# Patient Record
Sex: Female | Born: 1970 | Race: Black or African American | Hispanic: No | Marital: Single | Smoking: Never smoker
Health system: Southern US, Community
[De-identification: ages and names within clinical notes are randomized; demographics above are authoritative.]

## PROBLEM LIST (undated history)

## (undated) DIAGNOSIS — K5792 Diverticulitis of intestine, part unspecified, without perforation or abscess without bleeding: Secondary | ICD-10-CM

## (undated) DIAGNOSIS — N35919 Unspecified urethral stricture, male, unspecified site: Secondary | ICD-10-CM

## (undated) DIAGNOSIS — G43909 Migraine, unspecified, not intractable, without status migrainosus: Secondary | ICD-10-CM

## (undated) DIAGNOSIS — T7840XA Allergy, unspecified, initial encounter: Secondary | ICD-10-CM

## (undated) DIAGNOSIS — Z8489 Family history of other specified conditions: Secondary | ICD-10-CM

## (undated) HISTORY — DX: Migraine, unspecified, not intractable, without status migrainosus: G43.909

## (undated) HISTORY — DX: Unspecified urethral stricture, male, unspecified site: N35.919

## (undated) HISTORY — DX: Diverticulitis of intestine, part unspecified, without perforation or abscess without bleeding: K57.92

## (undated) HISTORY — DX: Allergy, unspecified, initial encounter: T78.40XA

---

## 2001-06-10 ENCOUNTER — Other Ambulatory Visit: Admission: RE | Admit: 2001-06-10 | Discharge: 2001-06-10 | Payer: Self-pay | Admitting: Gynecology

## 2002-06-16 ENCOUNTER — Other Ambulatory Visit: Admission: RE | Admit: 2002-06-16 | Discharge: 2002-06-16 | Payer: Self-pay | Admitting: Gynecology

## 2003-06-18 ENCOUNTER — Other Ambulatory Visit: Admission: RE | Admit: 2003-06-18 | Discharge: 2003-06-18 | Payer: Self-pay | Admitting: Gynecology

## 2004-06-20 ENCOUNTER — Other Ambulatory Visit: Admission: RE | Admit: 2004-06-20 | Discharge: 2004-06-20 | Payer: Self-pay | Admitting: Gynecology

## 2004-07-05 ENCOUNTER — Encounter: Admission: RE | Admit: 2004-07-05 | Discharge: 2004-07-05 | Payer: Self-pay | Admitting: Gynecology

## 2005-05-09 ENCOUNTER — Other Ambulatory Visit: Admission: RE | Admit: 2005-05-09 | Discharge: 2005-05-09 | Payer: Self-pay | Admitting: Gynecology

## 2006-05-15 ENCOUNTER — Other Ambulatory Visit: Admission: RE | Admit: 2006-05-15 | Discharge: 2006-05-15 | Payer: Self-pay | Admitting: Gynecology

## 2006-05-29 ENCOUNTER — Encounter: Admission: RE | Admit: 2006-05-29 | Discharge: 2006-05-29 | Payer: Self-pay | Admitting: Gynecology

## 2007-05-23 ENCOUNTER — Other Ambulatory Visit: Admission: RE | Admit: 2007-05-23 | Discharge: 2007-05-23 | Payer: Self-pay | Admitting: Gynecology

## 2008-01-16 ENCOUNTER — Ambulatory Visit: Payer: Self-pay | Admitting: Nurse Practitioner

## 2009-05-31 ENCOUNTER — Encounter: Admission: RE | Admit: 2009-05-31 | Discharge: 2009-05-31 | Payer: Self-pay | Admitting: Gynecology

## 2010-06-01 ENCOUNTER — Encounter: Admission: RE | Admit: 2010-06-01 | Discharge: 2010-06-01 | Payer: Self-pay | Admitting: Gynecology

## 2011-02-14 NOTE — Assessment & Plan Note (Signed)
NAMESHERVON, KERWIN NO.:  [id number]   MEDICAL RECORD NO.:  [id number]          PATIENT TYPE:  POB   LOCATION:  [facility] at [facility]         FACILITY:  [facility]   PHYSICIAN:  Elsie Lincoln, MD      DATE OF BIRTH:  [birth year]   DATE OF SERVICE:  01/16/2008                                  CLINIC NOTE   The patient comes to the office today for a consultation for her  migraine headaches.  The patient was referred from Dr. Johnn Hai office,  and specifically Jeani Sow, nurse practitioner, referred her.  She has  been working with this patient from a hormonal standpoint, and they have  not had any success to this point.  She states that her headaches began  in her early 42s.  She does have a mother and a sister who have severe  headaches.  Her sister, in fact, has a headache almost every day.  She  denies any aura.  She does have sensitivity to light, sounds.  She  occasionally has nausea and rarely has vomiting.  In a 30-day  standpoint, she may have three severe, one moderate, zero mild.  She  feels that her headaches go from 0 to 10 within a 20-minute period.  She  denies any increase in stress.  She is sleeping well.  She does not have  any social stressors at this point.  She has been taking Advil and  Tylenol occasionally.  The Tylenol has been no help and the Advil has  been some help.   MENSTRUAL HISTORY:  She has a regular menstrual cycle with light flow.  She is using condoms for birth control.   OBSTETRICAL HISTORY:  She is G0, P0.   GYNECOLOGIC HISTORY:  Last Pap smear was August of 2008.  No abnormal  Pap smears.   SURGICAL HISTORY:  No surgeries.   FAMILY HISTORY:  History of diabetes and high blood pressure in her  mother only.   MEDICATIONS:  NuvaRing.   PHYSICAL EXAMINATION:  VITAL SIGNS:  Blood pressure is 115/88, pulse is  98.  Weight is 129.  Height 5 feet 5 inches.   ALLERGIES:  No known allergies.   PHYSICAL EXAMINATION:  GENERAL:  A  well-developed, well-nourished 40-  year-old African-American female in no acute distress.  NEUROLOGIC:  The patient is intact.  She is well coordinated,  appropriate language and motor skills.  CARDIAC:  Regular rate and rhythm.  LUNGS:  Clear bilaterally without rales, rhonchi or wheezes.   ASSESSMENT:  Migraine without aura.   PLAN:  We had a lengthy discussion concerning migraine prodrome.  She is  not paying attention to her prodrome which is mental slowness.  She will  pay attention to that in the future.  She was given a prescription today  for Maxalt MLT to take with two Advil at the beginning of a headache.  She is given a prescription for #9 tablets with three refills.  She is  given a sample in the office today and tolerated that with no  difficulty.  We did discuss triggers for her migraines, and she will  follow up in this office p.r.n.  She can get her Maxalt refilled with  Jeani Sow, nurse practitioner, or she can return to see me at her  convenience.      Remonia Richter, NP    ______________________________  Elsie Lincoln, MD    LR/MEDQ  D:  01/16/2008  T:  01/16/2008  Job:  [id number]

## 2011-04-17 ENCOUNTER — Other Ambulatory Visit: Payer: Self-pay | Admitting: Gynecology

## 2011-04-17 DIAGNOSIS — Z1231 Encounter for screening mammogram for malignant neoplasm of breast: Secondary | ICD-10-CM

## 2011-06-06 ENCOUNTER — Ambulatory Visit: Payer: Self-pay

## 2011-06-12 ENCOUNTER — Ambulatory Visit
Admission: RE | Admit: 2011-06-12 | Discharge: 2011-06-12 | Disposition: A | Discharge status: Home / Self Care | Payer: BC Managed Care – PPO | Source: Ambulatory Visit | Attending: Gynecology | Admitting: Gynecology

## 2011-06-12 DIAGNOSIS — Z1231 Encounter for screening mammogram for malignant neoplasm of breast: Secondary | ICD-10-CM

## 2011-06-16 ENCOUNTER — Other Ambulatory Visit: Payer: Self-pay | Admitting: Gynecology

## 2011-06-16 DIAGNOSIS — R928 Other abnormal and inconclusive findings on diagnostic imaging of breast: Secondary | ICD-10-CM

## 2011-06-26 ENCOUNTER — Ambulatory Visit
Admission: RE | Admit: 2011-06-26 | Discharge: 2011-06-26 | Disposition: A | Discharge status: Home / Self Care | Payer: BC Managed Care – PPO | Source: Ambulatory Visit | Attending: Gynecology | Admitting: Gynecology

## 2011-06-26 DIAGNOSIS — R928 Other abnormal and inconclusive findings on diagnostic imaging of breast: Secondary | ICD-10-CM

## 2012-05-09 ENCOUNTER — Other Ambulatory Visit: Payer: Self-pay | Admitting: Gynecology

## 2012-05-09 DIAGNOSIS — Z1231 Encounter for screening mammogram for malignant neoplasm of breast: Secondary | ICD-10-CM

## 2012-06-19 DIAGNOSIS — G43009 Migraine without aura, not intractable, without status migrainosus: Secondary | ICD-10-CM | POA: Insufficient documentation

## 2012-06-26 ENCOUNTER — Ambulatory Visit
Admission: RE | Admit: 2012-06-26 | Discharge: 2012-06-26 | Disposition: A | Discharge status: Home / Self Care | Payer: BC Managed Care – PPO | Source: Ambulatory Visit | Attending: Gynecology | Admitting: Gynecology

## 2012-06-26 DIAGNOSIS — Z1231 Encounter for screening mammogram for malignant neoplasm of breast: Secondary | ICD-10-CM

## 2013-05-19 ENCOUNTER — Other Ambulatory Visit: Payer: Self-pay

## 2013-05-19 DIAGNOSIS — Z1231 Encounter for screening mammogram for malignant neoplasm of breast: Secondary | ICD-10-CM

## 2013-06-27 ENCOUNTER — Ambulatory Visit
Admission: RE | Admit: 2013-06-27 | Discharge: 2013-06-27 | Disposition: A | Discharge status: Home / Self Care | Payer: BC Managed Care – PPO | Source: Ambulatory Visit

## 2013-06-27 DIAGNOSIS — Z1231 Encounter for screening mammogram for malignant neoplasm of breast: Secondary | ICD-10-CM

## 2013-07-02 ENCOUNTER — Other Ambulatory Visit: Payer: Self-pay | Admitting: Gynecology

## 2013-07-02 DIAGNOSIS — R928 Other abnormal and inconclusive findings on diagnostic imaging of breast: Secondary | ICD-10-CM

## 2013-07-11 ENCOUNTER — Encounter: Payer: Self-pay | Admitting: Family Medicine

## 2013-07-11 ENCOUNTER — Ambulatory Visit (INDEPENDENT_AMBULATORY_CARE_PROVIDER_SITE_OTHER): Payer: BC Managed Care – PPO | Admitting: Family Medicine

## 2013-07-11 VITALS — BP 120/72 | HR 76 | Temp 98.6°F | Ht 65.5 in | Wt 134.6 lb

## 2013-07-11 DIAGNOSIS — Q383 Other congenital malformations of tongue: Secondary | ICD-10-CM

## 2013-07-11 DIAGNOSIS — K148 Other diseases of tongue: Secondary | ICD-10-CM

## 2013-07-11 DIAGNOSIS — Z23 Encounter for immunization: Secondary | ICD-10-CM

## 2013-07-11 NOTE — Assessment & Plan Note (Signed)
Seems to have resolved per pt She will return if it comes back

## 2013-07-11 NOTE — Progress Notes (Signed)
  Subjective:    Patient ID: Marilyn Green, female    DOB: May 18, 1971, 42 y.o.   MRN: [id number]  HPI Pt here c/o the feeling of a popcorn kernal in back of her throat.    Review of Systems As above    Objective:   Physical Exam BP 120/72  Pulse 76  Temp(Src) 98.6 F (37 C) (Oral)  Ht 5' 5.5" (1.664 m)  Wt 134 lb 9.6 oz (61.054 kg)  BMI 22.05 kg/m2  SpO2 99% General appearance: alert, cooperative, appears stated age and no distress Throat: lips, mucosa, and tongue normal; teeth and gums normal Neck: no adenopathy, no carotid bruit, no JVD, supple, symmetrical, trachea midline and thyroid not enlarged, symmetric, no tenderness/mass/nodules Lungs: clear to auscultation bilaterally Heart: S1, S2 normal       Assessment & Plan:

## 2013-07-13 ENCOUNTER — Encounter: Payer: Self-pay | Admitting: Family Medicine

## 2013-07-15 ENCOUNTER — Ambulatory Visit
Admission: RE | Admit: 2013-07-15 | Discharge: 2013-07-15 | Disposition: A | Discharge status: Home / Self Care | Payer: BC Managed Care – PPO | Source: Ambulatory Visit | Attending: Gynecology | Admitting: Gynecology

## 2013-07-15 DIAGNOSIS — R928 Other abnormal and inconclusive findings on diagnostic imaging of breast: Secondary | ICD-10-CM

## 2013-08-06 ENCOUNTER — Telehealth: Payer: Self-pay

## 2013-08-06 NOTE — Telephone Encounter (Addendum)
New patient---goes by Marilyn Green  Medication and allergies: reviewed and updated  90 day supply/mail order: na Local pharmacy: [facility]   Immunizations due:  Tdap  A/P:   FH and PSH reviewed and entered Has gyn--06/2013 per patient  To Discuss with Provider: Not at this time

## 2013-08-07 ENCOUNTER — Encounter: Payer: Self-pay | Admitting: Family Medicine

## 2013-08-07 ENCOUNTER — Ambulatory Visit (INDEPENDENT_AMBULATORY_CARE_PROVIDER_SITE_OTHER): Payer: BC Managed Care – PPO | Admitting: Family Medicine

## 2013-08-07 VITALS — BP 112/70 | HR 79 | Temp 98.9°F | Ht 65.5 in | Wt 133.8 lb

## 2013-08-07 DIAGNOSIS — Z0000 Encounter for general adult medical examination without abnormal findings: Secondary | ICD-10-CM

## 2013-08-07 NOTE — Progress Notes (Signed)
Subjective:     Marilyn Green is a 42 y.o. female and is here for a comprehensive physical exam. The patient reports no problems.  History   Social History  . Marital Status: Single    Spouse Name: N/A    Number of Children: N/A  . Years of Education: N/A   Occupational History  . Not on file.   Social History Main Topics  . Smoking status: Never Smoker   . Smokeless tobacco: Never Used  . Alcohol Use: Yes     Comment: Rarely  . Drug Use: No  . Sexual Activity: Not on file   Other Topics Concern  . Not on file   Social History Narrative  . No narrative on file   Health Maintenance  Topic Date Due  . Influenza Vaccine  05/02/2014  . Mammogram  07/15/2014  . Pap Smear  06/17/2016  . Tetanus/tdap  09/14/2019    The following portions of the patient's history were reviewed and updated as appropriate:  She  has a past medical history of Migraine. She  does not have any pertinent problems on file. She  has no past surgical history on file. Her family history includes Hyperlipidemia in her mother; Hypertension in her mother. She  reports that she has never smoked. She has never used smokeless tobacco. She reports that she drinks alcohol. She reports that she does not use illicit drugs. She has a current medication list which includes the following prescription(s): biotin, cholecalciferol, echinacea, levonorgestrel, magnesium oxide, metoclopramide, montelukast, relpax, and b-2. Current Outpatient Prescriptions on File Prior to Visit  Medication Sig Dispense Refill  . Biotin 1000 MCG tablet Take 1,000 mcg by mouth daily.      . cholecalciferol (VITAMIN D) 1000 UNITS tablet Take 1,000 Units by mouth daily.      Marland Kitchen ECHINACEA PO Take 1 tablet by mouth daily.      Marland Kitchen levonorgestrel (MIRENA) 20 MCG/24HR IUD 1 each by Intrauterine route once.      . magnesium oxide (MAG-OX) 400 MG tablet Take 400 mg by mouth daily.      . metoCLOPramide (REGLAN) 10 MG tablet Take 10 mg by mouth 4  (four) times daily.      . montelukast (SINGULAIR) 10 MG tablet Take 10 mg by mouth at bedtime.      . RELPAX 40 MG tablet Take 1 tablet by mouth as needed.      . Riboflavin (B-2) 100 MG TABS Take by mouth.       No current facility-administered medications on file prior to visit.   She has No Known Allergies..  Review of Systems Review of Systems  Constitutional: Negative for activity change, appetite change and fatigue.  HENT: Negative for hearing loss, congestion, tinnitus and ear discharge.  dentist q20m Eyes: Negative for visual disturbance (see optho q1y -- vision corrected to 20/20 with glasses).  Respiratory: Negative for cough, chest tightness and shortness of breath.   Cardiovascular: Negative for chest pain, palpitations and leg swelling.  Gastrointestinal: Negative for abdominal pain, diarrhea, constipation and abdominal distention.  Genitourinary: Negative for urgency, frequency, decreased urine volume and difficulty urinating.  Musculoskeletal: Negative for back pain, arthralgias and gait problem.  Skin: Negative for color change, pallor and rash.  Neurological: Negative for dizziness, light-headedness, numbness and headaches.  Hematological: Negative for adenopathy. Does not bruise/bleed easily.  Psychiatric/Behavioral: Negative for suicidal ideas, confusion, sleep disturbance, self-injury, dysphoric mood, decreased concentration and agitation.       Objective:  BP 112/70  Pulse 79  Temp(Src) 98.9 F (37.2 C) (Oral)  Ht 5' 5.5" (1.664 m)  Wt 133 lb 12.8 oz (60.691 kg)  BMI 21.92 kg/m2  SpO2 97% General appearance: alert, cooperative, appears stated age and no distress Head: Normocephalic, without obvious abnormality, atraumatic Eyes: conjunctivae/corneas clear. PERRL, EOM's intact. Fundi benign. Ears: normal TM's and external ear canals both ears Nose: Nares normal. Septum midline. Mucosa normal. No drainage or sinus tenderness. Throat: lips, mucosa, and  tongue normal; teeth and gums normal Neck: no adenopathy, no carotid bruit, no JVD, supple, symmetrical, trachea midline and thyroid not enlarged, symmetric, no tenderness/mass/nodules Back: symmetric, no curvature. ROM normal. No CVA tenderness. Lungs: clear to auscultation bilaterally Breasts: gyn Heart: regular rate and rhythm, S1, S2 normal, no murmur, click, rub or gallop Abdomen: soft, non-tender; bowel sounds normal; no masses,  no organomegaly Pelvic: deferredgyn Extremities: extremities normal, atraumatic, no cyanosis or edema Pulses: 2+ and symmetric Skin: Skin color, texture, turgor normal. No rashes or lesions Lymph nodes: Cervical, supraclavicular, and axillary nodes normal. Neurologic: Alert and oriented X 3, normal strength and tone. Normal symmetric reflexes. Normal coordination and gait Psych-- no anxiety, no depression      Assessment:    Healthy female exam.      Plan:    ghm utd Check labs See After Visit Summary for Counseling Recommendations

## 2013-08-07 NOTE — Patient Instructions (Signed)
Preventive Care for Adults, Female A healthy lifestyle and preventive care can promote health and wellness. Preventive health guidelines for women include the following key practices.  A routine yearly physical is a good way to check with your caregiver about your health and preventive screening. It is a chance to share any concerns and updates on your health, and to receive a thorough exam.  Visit your dentist for a routine exam and preventive care every 6 months. Brush your teeth twice a day and floss once a day. Good oral hygiene prevents tooth decay and gum disease.  The frequency of eye exams is based on your age, health, family medical history, use of contact lenses, and other factors. Follow your caregiver's recommendations for frequency of eye exams.  Eat a healthy diet. Foods like vegetables, fruits, whole grains, low-fat dairy products, and lean protein foods contain the nutrients you need without too many calories. Decrease your intake of foods high in solid fats, added sugars, and salt. Eat the right amount of calories for you.Get information about a proper diet from your caregiver, if necessary.  Regular physical exercise is one of the most important things you can do for your health. Most adults should get at least 150 minutes of moderate-intensity exercise (any activity that increases your heart rate and causes you to sweat) each week. In addition, most adults need muscle-strengthening exercises on 2 or more days a week.  Maintain a healthy weight. The body mass index (BMI) is a screening tool to identify possible weight problems. It provides an estimate of body fat based on height and weight. Your caregiver can help determine your BMI, and can help you achieve or maintain a healthy weight.For adults 20 years and older:  A BMI below 18.5 is considered underweight.  A BMI of 18.5 to 24.9 is normal.  A BMI of 25 to 29.9 is considered overweight.  A BMI of 30 and above is  considered obese.  Maintain normal blood lipids and cholesterol levels by exercising and minimizing your intake of saturated fat. Eat a balanced diet with plenty of fruit and vegetables. Blood tests for lipids and cholesterol should begin at age 20 and be repeated every 5 years. If your lipid or cholesterol levels are high, you are over 50, or you are at high risk for heart disease, you may need your cholesterol levels checked more frequently.Ongoing high lipid and cholesterol levels should be treated with medicines if diet and exercise are not effective.  If you smoke, find out from your caregiver how to quit. If you do not use tobacco, do not start.  Lung cancer screening is recommended for adults aged 55 80 years who are at high risk for developing lung cancer because of a history of smoking. Yearly low-dose computed tomography (CT) is recommended for people who have at least a 30-pack-year history of smoking and are a current smoker or have quit within the past 15 years. A pack year of smoking is smoking an average of 1 pack of cigarettes a day for 1 year (for example: 1 pack a day for 30 years or 2 packs a day for 15 years). Yearly screening should continue until the smoker has stopped smoking for at least 15 years. Yearly screening should also be stopped for people who develop a health problem that would prevent them from having lung cancer treatment.  If you are pregnant, do not drink alcohol. If you are breastfeeding, be very cautious about drinking alcohol. If you are   not pregnant and choose to drink alcohol, do not exceed 1 drink per day. One drink is considered to be 12 ounces (355 mL) of beer, 5 ounces (148 mL) of wine, or 1.5 ounces (44 mL) of liquor.  Avoid use of street drugs. Do not share needles with anyone. Ask for help if you need support or instructions about stopping the use of drugs.  High blood pressure causes heart disease and increases the risk of stroke. Your blood pressure  should be checked at least every 1 to 2 years. Ongoing high blood pressure should be treated with medicines if weight loss and exercise are not effective.  If you are 55 to 44 years old, ask your caregiver if you should take aspirin to prevent strokes.  Diabetes screening involves taking a blood sample to check your fasting blood sugar level. This should be done once every 3 years, after age 45, if you are within normal weight and without risk factors for diabetes. Testing should be considered at a younger age or be carried out more frequently if you are overweight and have at least 1 risk factor for diabetes.  Breast cancer screening is essential preventive care for women. You should practice "breast self-awareness." This means understanding the normal appearance and feel of your breasts and may include breast self-examination. Any changes detected, no matter how small, should be reported to a caregiver. Women in their 20s and 30s should have a clinical breast exam (CBE) by a caregiver as part of a regular health exam every 1 to 3 years. After age 40, women should have a CBE every year. Starting at age 40, women should consider having a mammography (breast X-ray test) every year. Women who have a family history of breast cancer should talk to their caregiver about genetic screening. Women at a high risk of breast cancer should talk to their caregivers about having magnetic resonance imaging (MRI) and a mammography every year.  Breast cancer gene (BRCA)-related cancer risk assessment is recommended for women who have family members with BRCA-related cancers. BRCA-related cancers include breast, ovarian, tubal, and peritoneal cancers. Having family members with these cancers may be associated with an increased risk for harmful changes (mutations) in the breast cancer genes BRCA1 and BRCA2. Results of the assessment will determine the need for genetic counseling and BRCA1 and BRCA2 testing.  The Pap test is  a screening test for cervical cancer. A Pap test can show cell changes on the cervix that might become cervical cancer if left untreated. A Pap test is a procedure in which cells are obtained and examined from the lower end of the uterus (cervix).  Women should have a Pap test starting at age 21.  Between ages 21 and 29, Pap tests should be repeated every 2 years.  Beginning at age 30, you should have a Pap test every 3 years as long as the past 3 Pap tests have been normal.  Some women have medical problems that increase the chance of getting cervical cancer. Talk to your caregiver about these problems. It is especially important to talk to your caregiver if a new problem develops soon after your last Pap test. In these cases, your caregiver may recommend more frequent screening and Pap tests.  The above recommendations are the same for women who have or have not gotten the vaccine for human papillomavirus (HPV).  If you had a hysterectomy for a problem that was not cancer or a condition that could lead to cancer, then   you no longer need Pap tests. Even if you no longer need a Pap test, a regular exam is a good idea to make sure no other problems are starting.  If you are between ages 65 and 70, and you have had normal Pap tests going back 10 years, you no longer need Pap tests. Even if you no longer need a Pap test, a regular exam is a good idea to make sure no other problems are starting.  If you have had past treatment for cervical cancer or a condition that could lead to cancer, you need Pap tests and screening for cancer for at least 20 years after your treatment.  If Pap tests have been discontinued, risk factors (such as a new sexual partner) need to be reassessed to determine if screening should be resumed.  The HPV test is an additional test that may be used for cervical cancer screening. The HPV test looks for the virus that can cause the cell changes on the cervix. The cells collected  during the Pap test can be tested for HPV. The HPV test could be used to screen women aged 30 years and older, and should be used in women of any age who have unclear Pap test results. After the age of 30, women should have HPV testing at the same frequency as a Pap test.  Colorectal cancer can be detected and often prevented. Most routine colorectal cancer screening begins at the age of 50 and continues through age 75. However, your caregiver may recommend screening at an earlier age if you have risk factors for colon cancer. On a yearly basis, your caregiver may provide home test kits to check for hidden blood in the stool. Use of a small camera at the end of a tube, to directly examine the colon (sigmoidoscopy or colonoscopy), can detect the earliest forms of colorectal cancer. Talk to your caregiver about this at age 50, when routine screening begins. Direct examination of the colon should be repeated every 5 to 10 years through age 75, unless early forms of pre-cancerous polyps or small growths are found.  Hepatitis C blood testing is recommended for all people born from 1945 through 1965 and any individual with known risks for hepatitis C.  Practice safe sex. Use condoms and avoid high-risk sexual practices to reduce the spread of sexually transmitted infections (STIs). STIs include gonorrhea, chlamydia, syphilis, trichomonas, herpes, HPV, and human immunodeficiency virus (HIV). Herpes, HIV, and HPV are viral illnesses that have no cure. They can result in disability, cancer, and death. Sexually active women aged 25 and younger should be checked for chlamydia. Older women with new or multiple partners should also be tested for chlamydia. Testing for other STIs is recommended if you are sexually active and at increased risk.  Osteoporosis is a disease in which the bones lose minerals and strength with aging. This can result in serious bone fractures. The risk of osteoporosis can be identified using a  bone density scan. Women ages 65 and over and women at risk for fractures or osteoporosis should discuss screening with their caregivers. Ask your caregiver whether you should take a calcium supplement or vitamin D to reduce the rate of osteoporosis.  Menopause can be associated with physical symptoms and risks. Hormone replacement therapy is available to decrease symptoms and risks. You should talk to your caregiver about whether hormone replacement therapy is right for you.  Use sunscreen. Apply sunscreen liberally and repeatedly throughout the day. You should seek shade   when your shadow is shorter than you. Protect yourself by wearing long sleeves, pants, a wide-brimmed hat, and sunglasses year round, whenever you are outdoors.  Once a month, do a whole body skin exam, using a mirror to look at the skin on your back. Notify your caregiver of new moles, moles that have irregular borders, moles that are larger than a pencil eraser, or moles that have changed in shape or color.  Stay current with required immunizations.  Influenza vaccine. All adults should be immunized every year.  Tetanus, diphtheria, and acellular pertussis (Td, Tdap) vaccine. Pregnant women should receive 1 dose of Tdap vaccine during each pregnancy. The dose should be obtained regardless of the length of time since the last dose. Immunization is preferred during the 27th to 36th week of gestation. An adult who has not previously received Tdap or who does not know her vaccine status should receive 1 dose of Tdap. This initial dose should be followed by tetanus and diphtheria toxoids (Td) booster doses every 10 years. Adults with an unknown or incomplete history of completing a 3-dose immunization series with Td-containing vaccines should begin or complete a primary immunization series including a Tdap dose. Adults should receive a Td booster every 10 years.  Varicella vaccine. An adult without evidence of immunity to varicella  should receive 2 doses or a second dose if she has previously received 1 dose. Pregnant females who do not have evidence of immunity should receive the first dose after pregnancy. This first dose should be obtained before leaving the health care facility. The second dose should be obtained 4 8 weeks after the first dose.  Human papillomavirus (HPV) vaccine. Females aged 13 26 years who have not received the vaccine previously should obtain the 3-dose series. The vaccine is not recommended for use in pregnant females. However, pregnancy testing is not needed before receiving a dose. If a female is found to be pregnant after receiving a dose, no treatment is needed. In that case, the remaining doses should be delayed until after the pregnancy. Immunization is recommended for any person with an immunocompromised condition through the age of 26 years if she did not get any or all doses earlier. During the 3-dose series, the second dose should be obtained 4 8 weeks after the first dose. The third dose should be obtained 24 weeks after the first dose and 16 weeks after the second dose.  Zoster vaccine. One dose is recommended for adults aged 60 years or older unless certain conditions are present.  Measles, mumps, and rubella (MMR) vaccine. Adults born before 1957 generally are considered immune to measles and mumps. Adults born in 1957 or later should have 1 or more doses of MMR vaccine unless there is a contraindication to the vaccine or there is laboratory evidence of immunity to each of the three diseases. A routine second dose of MMR vaccine should be obtained at least 28 days after the first dose for students attending postsecondary schools, health care workers, or international travelers. People who received inactivated measles vaccine or an unknown type of measles vaccine during 1963 1967 should receive 2 doses of MMR vaccine. People who received inactivated mumps vaccine or an unknown type of mumps vaccine  before 1979 and are at high risk for mumps infection should consider immunization with 2 doses of MMR vaccine. For females of childbearing age, rubella immunity should be determined. If there is no evidence of immunity, females who are not pregnant should be vaccinated. If there   is no evidence of immunity, females who are pregnant should delay immunization until after pregnancy. Unvaccinated health care workers born before 1957 who lack laboratory evidence of measles, mumps, or rubella immunity or laboratory confirmation of disease should consider measles and mumps immunization with 2 doses of MMR vaccine or rubella immunization with 1 dose of MMR vaccine.  Pneumococcal 13-valent conjugate (PCV13) vaccine. When indicated, a person who is uncertain of her immunization history and has no record of immunization should receive the PCV13 vaccine. An adult aged 19 years or older who has certain medical conditions and has not been previously immunized should receive 1 dose of PCV13 vaccine. This PCV13 should be followed with a dose of pneumococcal polysaccharide (PPSV23) vaccine. The PPSV23 vaccine dose should be obtained at least 8 weeks after the dose of PCV13 vaccine. An adult aged 19 years or older who has certain medical conditions and previously received 1 or more doses of PPSV23 vaccine should receive 1 dose of PCV13. The PCV13 vaccine dose should be obtained 1 or more years after the last PPSV23 vaccine dose.  Pneumococcal polysaccharide (PPSV23) vaccine. When PCV13 is also indicated, PCV13 should be obtained first. All adults aged 65 years and older should be immunized. An adult younger than age 65 years who has certain medical conditions should be immunized. Any person who resides in a nursing home or long-term care facility should be immunized. An adult smoker should be immunized. People with an immunocompromised condition and certain other conditions should receive both PCV13 and PPSV23 vaccines. People  with human immunodeficiency virus (HIV) infection should be immunized as soon as possible after diagnosis. Immunization during chemotherapy or radiation therapy should be avoided. Routine use of PPSV23 vaccine is not recommended for American Indians, [place] Natives, or people younger than 65 years unless there are medical conditions that require PPSV23 vaccine. When indicated, people who have unknown immunization and have no record of immunization should receive PPSV23 vaccine. One-time revaccination 5 years after the first dose of PPSV23 is recommended for people aged 19 64 years who have chronic kidney failure, nephrotic syndrome, asplenia, or immunocompromised conditions. People who received 1 2 doses of PPSV23 before age 65 years should receive another dose of PPSV23 vaccine at age 65 years or later if at least 5 years have passed since the previous dose. Doses of PPSV23 are not needed for people immunized with PPSV23 at or after age 65 years.  Meningococcal vaccine. Adults with asplenia or persistent complement component deficiencies should receive 2 doses of quadrivalent meningococcal conjugate (MenACWY-D) vaccine. The doses should be obtained at least 2 months apart. Microbiologists working with certain meningococcal bacteria, military recruits, people at risk during an outbreak, and people who travel to or live in countries with a high rate of meningitis should be immunized. A first-year college student up through age 21 years who is living in a residence hall should receive a dose if she did not receive a dose on or after her 16th birthday. Adults who have certain high-risk conditions should receive one or more doses of vaccine.  Hepatitis A vaccine. Adults who wish to be protected from this disease, have certain high-risk conditions, work with hepatitis A-infected animals, work in hepatitis A research labs, or travel to or work in countries with a high rate of hepatitis A should be immunized. Adults  who were previously unvaccinated and who anticipate close contact with an international adoptee during the first 60 days after arrival in the [place] from a country   with a high rate of hepatitis A should be immunized.  Hepatitis B vaccine. Adults who wish to be protected from this disease, have certain high-risk conditions, may be exposed to blood or other infectious body fluids, are household contacts or sex partners of hepatitis B positive people, are clients or workers in certain care facilities, or travel to or work in countries with a high rate of hepatitis B should be immunized.  Haemophilus influenzae type b (Hib) vaccine. A previously unvaccinated person with asplenia or sickle cell disease or having a scheduled splenectomy should receive 1 dose of Hib vaccine. Regardless of previous immunization, a recipient of a hematopoietic stem cell transplant should receive a 3-dose series 6 12 months after her successful transplant. Hib vaccine is not recommended for adults with HIV infection. Preventive Services / Frequency Ages 19 to 39  Blood pressure check.** / Every 1 to 2 years.  Lipid and cholesterol check.** / Every 5 years beginning at age 20.  Clinical breast exam.** / Every 3 years for women in their 20s and 30s.  BRCA-related cancer risk assessment.** / For women who have family members with a BRCA-related cancer (breast, ovarian, tubal, or peritoneal cancers).  Pap test.** / Every 2 years from ages 21 through 29. Every 3 years starting at age 30 through age 65 or 70 with a history of 3 consecutive normal Pap tests.  HPV screening.** / Every 3 years from ages 30 through ages 65 to 70 with a history of 3 consecutive normal Pap tests.  Hepatitis C blood test.** / For any individual with known risks for hepatitis C.  Skin self-exam. / Monthly.  Influenza vaccine. / Every year.  Tetanus, diphtheria, and acellular pertussis (Tdap, Td) vaccine.** / Consult your caregiver. Pregnant  women should receive 1 dose of Tdap vaccine during each pregnancy. 1 dose of Td every 10 years.  Varicella vaccine.** / Consult your caregiver. Pregnant females who do not have evidence of immunity should receive the first dose after pregnancy.  HPV vaccine. / 3 doses over 6 months, if 26 and younger. The vaccine is not recommended for use in pregnant females. However, pregnancy testing is not needed before receiving a dose.  Measles, mumps, rubella (MMR) vaccine.** / You need at least 1 dose of MMR if you were born in 1957 or later. You may also need a 2nd dose. For females of childbearing age, rubella immunity should be determined. If there is no evidence of immunity, females who are not pregnant should be vaccinated. If there is no evidence of immunity, females who are pregnant should delay immunization until after pregnancy.  Pneumococcal 13-valent conjugate (PCV13) vaccine.** / Consult your caregiver.  Pneumococcal polysaccharide (PPSV23) vaccine.** / 1 to 2 doses if you smoke cigarettes or if you have certain conditions.  Meningococcal vaccine.** / 1 dose if you are age 19 to 21 years and a first-year college student living in a residence hall, or have one of several medical conditions, you need to get vaccinated against meningococcal disease. You may also need additional booster doses.  Hepatitis A vaccine.** / Consult your caregiver.  Hepatitis B vaccine.** / Consult your caregiver.  Haemophilus influenzae type b (Hib) vaccine.** / Consult your caregiver. Ages 40 to 64  Blood pressure check.** / Every 1 to 2 years.  Lipid and cholesterol check.** / Every 5 years beginning at age 20.  Lung cancer screening. / Every year if you are aged 55 80 years and have a 30-pack-year history of smoking and   currently smoke or have quit within the past 15 years. Yearly screening is stopped once you have quit smoking for at least 15 years or develop a health problem that would prevent you from having  lung cancer treatment.  Clinical breast exam.** / Every year after age 40.  BRCA-related cancer risk assessment.** / For women who have family members with a BRCA-related cancer (breast, ovarian, tubal, or peritoneal cancers).  Mammogram.** / Every year beginning at age 40 and continuing for as long as you are in good health. Consult with your caregiver.  Pap test.** / Every 3 years starting at age 30 through age 65 or 70 with a history of 3 consecutive normal Pap tests.  HPV screening.** / Every 3 years from ages 30 through ages 65 to 70 with a history of 3 consecutive normal Pap tests.  Fecal occult blood test (FOBT) of stool. / Every year beginning at age 50 and continuing until age 75. You may not need to do this test if you get a colonoscopy every 10 years.  Flexible sigmoidoscopy or colonoscopy.** / Every 5 years for a flexible sigmoidoscopy or every 10 years for a colonoscopy beginning at age 50 and continuing until age 75.  Hepatitis C blood test.** / For all people born from 1945 through 1965 and any individual with known risks for hepatitis C.  Skin self-exam. / Monthly.  Influenza vaccine. / Every year.  Tetanus, diphtheria, and acellular pertussis (Tdap/Td) vaccine.** / Consult your caregiver. Pregnant women should receive 1 dose of Tdap vaccine during each pregnancy. 1 dose of Td every 10 years.  Varicella vaccine.** / Consult your caregiver. Pregnant females who do not have evidence of immunity should receive the first dose after pregnancy.  Zoster vaccine.** / 1 dose for adults aged 60 years or older.  Measles, mumps, rubella (MMR) vaccine.** / You need at least 1 dose of MMR if you were born in 1957 or later. You may also need a 2nd dose. For females of childbearing age, rubella immunity should be determined. If there is no evidence of immunity, females who are not pregnant should be vaccinated. If there is no evidence of immunity, females who are pregnant should delay  immunization until after pregnancy.  Pneumococcal 13-valent conjugate (PCV13) vaccine.** / Consult your caregiver.  Pneumococcal polysaccharide (PPSV23) vaccine.** / 1 to 2 doses if you smoke cigarettes or if you have certain conditions.  Meningococcal vaccine.** / Consult your caregiver.  Hepatitis A vaccine.** / Consult your caregiver.  Hepatitis B vaccine.** / Consult your caregiver.  Haemophilus influenzae type b (Hib) vaccine.** / Consult your caregiver. Ages 65 and over  Blood pressure check.** / Every 1 to 2 years.  Lipid and cholesterol check.** / Every 5 years beginning at age 20.  Lung cancer screening. / Every year if you are aged 55 80 years and have a 30-pack-year history of smoking and currently smoke or have quit within the past 15 years. Yearly screening is stopped once you have quit smoking for at least 15 years or develop a health problem that would prevent you from having lung cancer treatment.  Clinical breast exam.** / Every year after age 40.  BRCA-related cancer risk assessment.** / For women who have family members with a BRCA-related cancer (breast, ovarian, tubal, or peritoneal cancers).  Mammogram.** / Every year beginning at age 40 and continuing for as long as you are in good health. Consult with your caregiver.  Pap test.** / Every 3 years starting at age   30 through age 65 or 70 with a 3 consecutive normal Pap tests. Testing can be stopped between 65 and 70 with 3 consecutive normal Pap tests and no abnormal Pap or HPV tests in the past 10 years.  HPV screening.** / Every 3 years from ages 30 through ages 65 or 70 with a history of 3 consecutive normal Pap tests. Testing can be stopped between 65 and 70 with 3 consecutive normal Pap tests and no abnormal Pap or HPV tests in the past 10 years.  Fecal occult blood test (FOBT) of stool. / Every year beginning at age 50 and continuing until age 75. You may not need to do this test if you get a colonoscopy  every 10 years.  Flexible sigmoidoscopy or colonoscopy.** / Every 5 years for a flexible sigmoidoscopy or every 10 years for a colonoscopy beginning at age 50 and continuing until age 75.  Hepatitis C blood test.** / For all people born from 1945 through 1965 and any individual with known risks for hepatitis C.  Osteoporosis screening.** / A one-time screening for women ages 65 and over and women at risk for fractures or osteoporosis.  Skin self-exam. / Monthly.  Influenza vaccine. / Every year.  Tetanus, diphtheria, and acellular pertussis (Tdap/Td) vaccine.** / 1 dose of Td every 10 years.  Varicella vaccine.** / Consult your caregiver.  Zoster vaccine.** / 1 dose for adults aged 60 years or older.  Pneumococcal 13-valent conjugate (PCV13) vaccine.** / Consult your caregiver.  Pneumococcal polysaccharide (PPSV23) vaccine.** / 1 dose for all adults aged 65 years and older.  Meningococcal vaccine.** / Consult your caregiver.  Hepatitis A vaccine.** / Consult your caregiver.  Hepatitis B vaccine.** / Consult your caregiver.  Haemophilus influenzae type b (Hib) vaccine.** / Consult your caregiver. ** Family history and personal history of risk and conditions may change your caregiver's recommendations. Document Released: 11/14/2001 Document Revised: 01/13/2013 Document Reviewed: 02/13/2011 Exit[employer] Patient Information 2014 [employer].  

## 2013-08-13 ENCOUNTER — Other Ambulatory Visit (INDEPENDENT_AMBULATORY_CARE_PROVIDER_SITE_OTHER): Payer: BC Managed Care – PPO

## 2013-08-13 DIAGNOSIS — Z0000 Encounter for general adult medical examination without abnormal findings: Secondary | ICD-10-CM

## 2013-08-13 DIAGNOSIS — R319 Hematuria, unspecified: Secondary | ICD-10-CM

## 2013-08-13 LAB — POCT URINALYSIS DIPSTICK
Bilirubin, UA: NEGATIVE
Glucose, UA: NEGATIVE
Ketones, UA: NEGATIVE
Leukocytes, UA: NEGATIVE
Nitrite, UA: NEGATIVE
Protein, UA: NEGATIVE
Spec Grav, UA: >=1.03
Urobilinogen, UA: 0.2
pH, UA: 6

## 2013-08-13 LAB — LIPID PANEL
Cholesterol: 196 mg/dL (ref 0–200)
HDL: 61.3 mg/dL (ref >39.00)
LDL Cholesterol: 127 mg/dL — ABNORMAL HIGH (ref 0–99)
Total CHOL/HDL Ratio: 3
Triglycerides: 40 mg/dL (ref 0.0–149.0)
VLDL: 8 mg/dL (ref 0.0–40.0)

## 2013-08-13 LAB — HEPATIC FUNCTION PANEL
ALT: 14 U/L (ref 0–35)
AST: 16 U/L (ref 0–37)
Albumin: 3.9 g/dL (ref 3.5–5.2)
Alkaline Phosphatase: 53 U/L (ref 39–117)
Bilirubin, Direct: 0 mg/dL (ref 0.0–0.3)
Total Bilirubin: 0.9 mg/dL (ref 0.3–1.2)
Total Protein: 7.1 g/dL (ref 6.0–8.3)

## 2013-08-13 LAB — CBC WITH DIFFERENTIAL/PLATELET
Basophils Absolute: 0 10*3/uL (ref 0.0–0.1)
Basophils Relative: 0.6 % (ref 0.0–3.0)
Eosinophils Absolute: 0.2 10*3/uL (ref 0.0–0.7)
Eosinophils Relative: 6 % — ABNORMAL HIGH (ref 0.0–5.0)
HCT: 38.7 % (ref 36.0–46.0)
Hemoglobin: 12.8 g/dL (ref 12.0–15.0)
Lymphocytes Relative: 40.7 % (ref 12.0–46.0)
Lymphs Abs: 1.7 10*3/uL (ref 0.7–4.0)
MCHC: 33.1 g/dL (ref 30.0–36.0)
MCV: 86.6 fl (ref 78.0–100.0)
Monocytes Absolute: 0.4 10*3/uL (ref 0.1–1.0)
Monocytes Relative: 8.6 % (ref 3.0–12.0)
Neutro Abs: 1.8 10*3/uL (ref 1.4–7.7)
Neutrophils Relative %: 44.1 % (ref 43.0–77.0)
Platelets: 183 10*3/uL (ref 150.0–400.0)
RBC: 4.47 Mil/uL (ref 3.87–5.11)
RDW: 13.3 % (ref 11.5–14.6)
WBC: 4.1 10*3/uL — ABNORMAL LOW (ref 4.5–10.5)

## 2013-08-13 LAB — BASIC METABOLIC PANEL
BUN: 13 mg/dL (ref 6–23)
CO2: 26 mEq/L (ref 19–32)
Calcium: 8.9 mg/dL (ref 8.4–10.5)
Chloride: 104 mEq/L (ref 96–112)
Creatinine, Ser: 0.8 mg/dL (ref 0.4–1.2)
GFR: 94.63 mL/min (ref >60.00)
Glucose, Bld: 76 mg/dL (ref 70–99)
Potassium: 3.6 mEq/L (ref 3.5–5.1)
Sodium: 137 mEq/L (ref 135–145)

## 2013-08-13 LAB — TSH: TSH: 1.99 u[IU]/mL (ref 0.35–5.50)

## 2013-08-15 LAB — URINE CULTURE: Organism ID, Bacteria: NO GROWTH

## 2013-08-22 ENCOUNTER — Ambulatory Visit: Payer: BC Managed Care – PPO | Admitting: Family Medicine

## 2014-02-15 ENCOUNTER — Encounter (HOSPITAL_BASED_OUTPATIENT_CLINIC_OR_DEPARTMENT_OTHER): Payer: Self-pay | Admitting: Emergency Medicine

## 2014-02-15 ENCOUNTER — Emergency Department (HOSPITAL_BASED_OUTPATIENT_CLINIC_OR_DEPARTMENT_OTHER)
Admission: EM | Admit: 2014-02-15 | Discharge: 2014-02-15 | Disposition: A | Discharge status: Home / Self Care | Payer: BC Managed Care – PPO | Attending: Emergency Medicine | Admitting: Emergency Medicine

## 2014-02-15 DIAGNOSIS — G43909 Migraine, unspecified, not intractable, without status migrainosus: Secondary | ICD-10-CM | POA: Insufficient documentation

## 2014-02-15 DIAGNOSIS — Z79899 Other long term (current) drug therapy: Secondary | ICD-10-CM | POA: Insufficient documentation

## 2014-02-15 DIAGNOSIS — R42 Dizziness and giddiness: Secondary | ICD-10-CM | POA: Insufficient documentation

## 2014-02-15 DIAGNOSIS — Z3202 Encounter for pregnancy test, result negative: Secondary | ICD-10-CM | POA: Insufficient documentation

## 2014-02-15 LAB — URINALYSIS, ROUTINE W REFLEX MICROSCOPIC
Bilirubin Urine: NEGATIVE
GLUCOSE, UA: NEGATIVE mg/dL
KETONES UR: 15 mg/dL — AB
Leukocytes, UA: NEGATIVE
Nitrite: NEGATIVE
PROTEIN: NEGATIVE mg/dL
Specific Gravity, Urine: 1.007 (ref 1.005–1.030)
UROBILINOGEN UA: 0.2 mg/dL (ref 0.0–1.0)
pH: 6 (ref 5.0–8.0)

## 2014-02-15 LAB — COMPREHENSIVE METABOLIC PANEL
ALT: 10 U/L (ref 0–35)
AST: 13 U/L (ref 0–37)
Albumin: 3.7 g/dL (ref 3.5–5.2)
Alkaline Phosphatase: 45 U/L (ref 39–117)
BUN: 10 mg/dL (ref 6–23)
CALCIUM: 8.4 mg/dL (ref 8.4–10.5)
CO2: 22 mEq/L (ref 19–32)
Chloride: 105 mEq/L (ref 96–112)
Creatinine, Ser: 0.7 mg/dL (ref 0.50–1.10)
GFR calc Af Amer: >90 mL/min (ref >90)
GFR calc non Af Amer: >90 mL/min (ref >90)
Glucose, Bld: 92 mg/dL (ref 70–99)
POTASSIUM: 3.9 meq/L (ref 3.7–5.3)
SODIUM: 139 meq/L (ref 137–147)
TOTAL PROTEIN: 6.6 g/dL (ref 6.0–8.3)
Total Bilirubin: 0.6 mg/dL (ref 0.3–1.2)

## 2014-02-15 LAB — CBC WITH DIFFERENTIAL/PLATELET
BASOS PCT: 0 % (ref 0–1)
Basophils Absolute: 0 10*3/uL (ref 0.0–0.1)
EOS ABS: 0 10*3/uL (ref 0.0–0.7)
EOS PCT: 0 % (ref 0–5)
HCT: 39.6 % (ref 36.0–46.0)
Hemoglobin: 13.4 g/dL (ref 12.0–15.0)
LYMPHS ABS: 0.6 10*3/uL — AB (ref 0.7–4.0)
Lymphocytes Relative: 11 % — ABNORMAL LOW (ref 12–46)
MCH: 29.6 pg (ref 26.0–34.0)
MCHC: 33.8 g/dL (ref 30.0–36.0)
MCV: 87.4 fL (ref 78.0–100.0)
Monocytes Absolute: 0.3 10*3/uL (ref 0.1–1.0)
Monocytes Relative: 5 % (ref 3–12)
NEUTROS PCT: 84 % — AB (ref 43–77)
Neutro Abs: 4.4 10*3/uL (ref 1.7–7.7)
PLATELETS: 169 10*3/uL (ref 150–400)
RBC: 4.53 MIL/uL (ref 3.87–5.11)
RDW: 12.7 % (ref 11.5–15.5)
WBC: 5.3 10*3/uL (ref 4.0–10.5)

## 2014-02-15 LAB — URINE MICROSCOPIC-ADD ON

## 2014-02-15 LAB — PREGNANCY, URINE: PREG TEST UR: NEGATIVE

## 2014-02-15 MED ORDER — ONDANSETRON HCL 4 MG/2ML IJ SOLN
4.0000 mg | Freq: Once | INTRAMUSCULAR | Status: AC
  Administered 2014-02-15: 4 mg via INTRAVENOUS
  Filled 2014-02-15: qty 2

## 2014-02-15 MED ORDER — LORAZEPAM 2 MG/ML IJ SOLN
0.5000 mg | Freq: Once | INTRAMUSCULAR | Status: AC
  Administered 2014-02-15: 0.5 mg via INTRAVENOUS
  Filled 2014-02-15: qty 1

## 2014-02-15 MED ORDER — ONDANSETRON HCL 4 MG PO TABS: 4.0000 mg | ORAL_TABLET | Freq: Four times a day (QID) | ORAL | Status: DC

## 2014-02-15 MED ORDER — MECLIZINE HCL 25 MG PO TABS: 25.0000 mg | ORAL_TABLET | Freq: Three times a day (TID) | ORAL | Status: DC

## 2014-02-15 MED ORDER — MECLIZINE HCL 25 MG PO TABS
50.0000 mg | ORAL_TABLET | Freq: Once | ORAL | Status: AC
  Administered 2014-02-15: 50 mg via ORAL
  Filled 2014-02-15: qty 2

## 2014-02-15 MED ORDER — SODIUM CHLORIDE 0.9 % IV BOLUS (SEPSIS)
1000.0000 mL | Freq: Once | INTRAVENOUS | Status: AC
  Administered 2014-02-15: 1000 mL via INTRAVENOUS

## 2014-02-15 NOTE — ED Notes (Signed)
Unable to void at this time.

## 2014-02-15 NOTE — ED Notes (Signed)
IV infusing to left AC to EMS NSL established PTA.

## 2014-02-15 NOTE — ED Notes (Signed)
Patient denies abdominal pain.

## 2014-02-15 NOTE — ED Notes (Signed)
Recollect CMP d/t gross hemolysis of last specimen.  Patient still c/o dizziness, nausea.  Medications given.  Warm blankets applied.

## 2014-02-15 NOTE — ED Notes (Signed)
Nausea, vomiting, and diarrhea since 0400 this morning.  Multiple episodes.  EMS initiated NSL 20 gauge left AC, Zofran 4 mg IV administered.  BP 140/60, HR 90, RR 18 RR.

## 2014-02-15 NOTE — ED Provider Notes (Signed)
CSN: [id number]     Arrival date & time 02/15/14  1415 History   First MD Initiated Contact with Patient 02/15/14 1443     Chief Complaint  Patient presents with  . Emesis     (Consider location/radiation/quality/duration/timing/severity/associated sxs/prior Treatment) Patient is a 43 y.o. female presenting with dizziness. The history is provided by the patient. No language interpreter was used.  Dizziness Quality:  Room spinning Severity:  Severe Onset quality:  Unable to specify Duration:  12 hours Timing:  Constant Associated symptoms: nausea and vomiting   Associated symptoms: no diarrhea, no headaches, no hearing loss and no shortness of breath   Associated symptoms comment:  She woke this morning with symptoms of room-spinning dizziness, associated with nausea and limited number of vomiting episodes. No fever. No recent illness. She has had loose stool x 1 today. No history of vertigo.   Past Medical History  Diagnosis Date  . Migraine    History reviewed. No pertinent past surgical history. Family History  Problem Relation Age of Onset  . Hyperlipidemia Mother   . Hypertension Mother    History  Substance Use Topics  . Smoking status: Never Smoker   . Smokeless tobacco: Never Used  . Alcohol Use: Yes     Comment: Rarely   OB History   Grav Para Term Preterm Abortions TAB SAB Ect Mult Living                 Review of Systems  Constitutional: Negative for fever.  HENT: Negative for congestion, ear pain and hearing loss.   Respiratory: Negative for cough and shortness of breath.   Gastrointestinal: Positive for nausea and vomiting. Negative for abdominal pain and diarrhea.  Genitourinary: Negative for dysuria.  Musculoskeletal: Negative for gait problem.  Neurological: Positive for dizziness. Negative for syncope, speech difficulty and headaches.  Psychiatric/Behavioral: Negative for confusion.      Allergies  Review of patient's allergies indicates no  known allergies.  Home Medications   Prior to Admission medications   Medication Sig Start Date End Date Taking? Authorizing Provider  Biotin 1000 MCG tablet Take 1,000 mcg by mouth daily.    Historical Provider, MD  cholecalciferol (VITAMIN D) 1000 UNITS tablet Take 1,000 Units by mouth daily.    Historical Provider, MD  ECHINACEA PO Take 1 tablet by mouth daily.    Historical Provider, MD  levonorgestrel (MIRENA) 20 MCG/24HR IUD 1 each by Intrauterine route once.    Historical Provider, MD  magnesium oxide (MAG-OX) 400 MG tablet Take 400 mg by mouth daily.    Historical Provider, MD  metoCLOPramide (REGLAN) 10 MG tablet Take 10 mg by mouth 4 (four) times daily.    Historical Provider, MD  montelukast (SINGULAIR) 10 MG tablet Take 10 mg by mouth at bedtime.    Historical Provider, MD  RELPAX 40 MG tablet Take 1 tablet by mouth as needed. 06/30/13   Historical Provider, MD  Riboflavin (B-2) 100 MG TABS Take by mouth.    Historical Provider, MD   BP 139/77  Pulse 83  Temp(Src) 98.3 F (36.8 C) (Oral)  Resp 18  Ht 5' 4.5" (1.638 m)  Wt 135 lb (61.236 kg)  BMI 22.82 kg/m2  SpO2 100%  LMP 01/25/2014 Physical Exam  Constitutional: She is oriented to person, place, and time. She appears well-developed and well-nourished. No distress.  Eyes: Pupils are equal, round, and reactive to light.  Mild conjunctival pallor.  Neck: Normal range of motion.  Cardiovascular: Normal  rate.   No murmur heard. Pulmonary/Chest: Effort normal. She has no wheezes. She has no rales.  Abdominal: There is no tenderness.  Musculoskeletal: Normal range of motion.  Neurological: She is alert and oriented to person, place, and time.  Finger-to-nose, heel-to-shin without deficit. Negative Romberg, no pronator drift. CN's 3-12 grossly intact.   Skin: Skin is warm and dry.    ED Course  Procedures (including critical care time) Labs Review Labs Reviewed  CBC WITH DIFFERENTIAL - Abnormal; Notable for the  following:    Neutrophils Relative % 84 (*)    Lymphocytes Relative 11 (*)    Lymphs Abs 0.6 (*)    All other components within normal limits  COMPREHENSIVE METABOLIC PANEL  URINALYSIS, ROUTINE W REFLEX MICROSCOPIC  PREGNANCY, URINE  COMPREHENSIVE METABOLIC PANEL   Results for orders placed during the hospital encounter of 02/15/14  CBC WITH DIFFERENTIAL      Result Value Ref Range   WBC 5.3  4.0 - 10.5 K/uL   RBC 4.53  3.87 - 5.11 MIL/uL   Hemoglobin 13.4  12.0 - 15.0 g/dL   HCT 39.6  36.0 - 46.0 %   MCV 87.4  78.0 - 100.0 fL   MCH 29.6  26.0 - 34.0 pg   MCHC 33.8  30.0 - 36.0 g/dL   RDW 12.7  11.5 - 15.5 %   Platelets 169  150 - 400 K/uL   Neutrophils Relative % 84 (*) 43 - 77 %   Neutro Abs 4.4  1.7 - 7.7 K/uL   Lymphocytes Relative 11 (*) 12 - 46 %   Lymphs Abs 0.6 (*) 0.7 - 4.0 K/uL   Monocytes Relative 5  3 - 12 %   Monocytes Absolute 0.3  0.1 - 1.0 K/uL   Eosinophils Relative 0  0 - 5 %   Eosinophils Absolute 0.0  0.0 - 0.7 K/uL   Basophils Relative 0  0 - 1 %   Basophils Absolute 0.0  0.0 - 0.1 K/uL  URINALYSIS, ROUTINE W REFLEX MICROSCOPIC      Result Value Ref Range   Color, Urine YELLOW  YELLOW   APPearance CLEAR  CLEAR   Specific Gravity, Urine 1.007  1.005 - 1.030   pH 6.0  5.0 - 8.0   Glucose, UA NEGATIVE  NEGATIVE mg/dL   Hgb urine dipstick SMALL (*) NEGATIVE   Bilirubin Urine NEGATIVE  NEGATIVE   Ketones, ur 15 (*) NEGATIVE mg/dL   Protein, ur NEGATIVE  NEGATIVE mg/dL   Urobilinogen, UA 0.2  0.0 - 1.0 mg/dL   Nitrite NEGATIVE  NEGATIVE   Leukocytes, UA NEGATIVE  NEGATIVE  PREGNANCY, URINE      Result Value Ref Range   Preg Test, Ur NEGATIVE  NEGATIVE  COMPREHENSIVE METABOLIC PANEL      Result Value Ref Range   Sodium 139  137 - 147 mEq/L   Potassium 3.9  3.7 - 5.3 mEq/L   Chloride 105  96 - 112 mEq/L   CO2 22  19 - 32 mEq/L   Glucose, Bld 92  70 - 99 mg/dL   BUN 10  6 - 23 mg/dL   Creatinine, Ser 0.70  0.50 - 1.10 mg/dL   Calcium 8.4  8.4 -  10.5 mg/dL   Total Protein 6.6  6.0 - 8.3 g/dL   Albumin 3.7  3.5 - 5.2 g/dL   AST 13  0 - 37 U/L   ALT 10  0 - 35 U/L   Alkaline  Phosphatase 45  39 - 117 U/L   Total Bilirubin 0.6  0.3 - 1.2 mg/dL   GFR calc non Af Amer >90  >90 mL/min   GFR calc Af Amer >90  >90 mL/min  URINE MICROSCOPIC-ADD ON      Result Value Ref Range   Squamous Epithelial / LPF RARE  RARE   RBC / HPF 0-2  <3 RBC/hpf   Bacteria, UA RARE  RARE   Urine-Other MUCOUS PRESENT      Imaging Review No results found.   EKG Interpretation None      MDM   Final diagnoses:  None    1. Vertigo  She feels some better with Meclizine. No neurologic deficits on exam - no change in exam during course of ED visit. Family with patient to take her home. Encouraged 2 day recheck with her PCP.  Dewaine Oats, PA-C 02/15/14 1741

## 2014-02-15 NOTE — Discharge Instructions (Signed)

## 2014-02-16 NOTE — ED Provider Notes (Signed)
History/physical exam/procedure(s) were performed by non-physician practitioner and as supervising physician I was immediately available for consultation/collaboration. I have reviewed all notes and am in agreement with care and plan.   Shaune Pollack, MD 02/16/14 [phone]

## 2014-02-19 ENCOUNTER — Other Ambulatory Visit: Payer: Self-pay

## 2014-02-19 ENCOUNTER — Ambulatory Visit (INDEPENDENT_AMBULATORY_CARE_PROVIDER_SITE_OTHER): Payer: BC Managed Care – PPO | Admitting: Family Medicine

## 2014-02-19 ENCOUNTER — Encounter: Payer: Self-pay | Admitting: Family Medicine

## 2014-02-19 VITALS — BP 132/82 | HR 71 | Temp 98.0°F | Wt 131.4 lb

## 2014-02-19 DIAGNOSIS — R112 Nausea with vomiting, unspecified: Secondary | ICD-10-CM

## 2014-02-19 DIAGNOSIS — R319 Hematuria, unspecified: Secondary | ICD-10-CM

## 2014-02-19 DIAGNOSIS — H8110 Benign paroxysmal vertigo, unspecified ear: Secondary | ICD-10-CM

## 2014-02-19 LAB — CBC WITH DIFFERENTIAL/PLATELET
BASOS PCT: 0.7 % (ref 0.0–3.0)
Basophils Absolute: 0 10*3/uL (ref 0.0–0.1)
EOS PCT: 3.8 % (ref 0.0–5.0)
Eosinophils Absolute: 0.2 10*3/uL (ref 0.0–0.7)
HEMATOCRIT: 37.5 % (ref 36.0–46.0)
HEMOGLOBIN: 12.4 g/dL (ref 12.0–15.0)
LYMPHS ABS: 1.7 10*3/uL (ref 0.7–4.0)
Lymphocytes Relative: 40.8 % (ref 12.0–46.0)
MCHC: 33.1 g/dL (ref 30.0–36.0)
MCV: 88.3 fl (ref 78.0–100.0)
MONO ABS: 0.4 10*3/uL (ref 0.1–1.0)
Monocytes Relative: 10.6 % (ref 3.0–12.0)
NEUTROS ABS: 1.9 10*3/uL (ref 1.4–7.7)
Neutrophils Relative %: 44.1 % (ref 43.0–77.0)
Platelets: 183 10*3/uL (ref 150.0–400.0)
RBC: 4.25 Mil/uL (ref 3.87–5.11)
RDW: 13.3 % (ref 11.5–15.5)
WBC: 4.2 10*3/uL (ref 4.0–10.5)

## 2014-02-19 LAB — POCT URINALYSIS DIPSTICK
Bilirubin, UA: NEGATIVE
Glucose, UA: NEGATIVE
Ketones, UA: NEGATIVE
Leukocytes, UA: NEGATIVE
Nitrite, UA: NEGATIVE
PROTEIN UA: NEGATIVE
Spec Grav, UA: <=1.005
UROBILINOGEN UA: 0.2
pH, UA: 8

## 2014-02-19 LAB — BASIC METABOLIC PANEL
BUN: 7 mg/dL (ref 6–23)
CHLORIDE: 108 meq/L (ref 96–112)
CO2: 28 mEq/L (ref 19–32)
Calcium: 9.1 mg/dL (ref 8.4–10.5)
Creatinine, Ser: 0.9 mg/dL (ref 0.4–1.2)
GFR: 90.66 mL/min (ref >60.00)
Glucose, Bld: 78 mg/dL (ref 70–99)
Potassium: 4.5 mEq/L (ref 3.5–5.1)
SODIUM: 142 meq/L (ref 135–145)

## 2014-02-19 LAB — HEPATIC FUNCTION PANEL
ALBUMIN: 3.9 g/dL (ref 3.5–5.2)
ALT: 11 U/L (ref 0–35)
AST: 15 U/L (ref 0–37)
Alkaline Phosphatase: 39 U/L (ref 39–117)
Bilirubin, Direct: 0.1 mg/dL (ref 0.0–0.3)
TOTAL PROTEIN: 6.8 g/dL (ref 6.0–8.3)
Total Bilirubin: 0.6 mg/dL (ref 0.2–1.2)

## 2014-02-19 NOTE — Progress Notes (Signed)
Pre visit review using our clinic review tool, if applicable. No additional management support is needed unless otherwise documented below in the visit note. 

## 2014-02-19 NOTE — Patient Instructions (Signed)

## 2014-02-19 NOTE — Progress Notes (Signed)
   Subjective:    Patient ID: Marilyn Green, female    DOB: 01-27-1971, 43 y.o.   MRN: [id number]  HPI Pt here f/u ER for vertigo and N/V.  Meclizine was given to pt in ER 0-- it is not working as well as it did.  Overall she does fill better.   ER record reviewed.     Review of Systems As above     Objective:   Physical Exam  BP 132/82  Pulse 71  Temp(Src) 98 F (36.7 C) (Oral)  Wt 131 lb 6.4 oz (59.603 kg)  SpO2 98%  LMP 01/25/2014 General appearance: alert, cooperative, appears stated age and no distress Throat: lips, mucosa, and tongue normal; teeth and gums normal Neck: no adenopathy, no carotid bruit, no JVD, supple, symmetrical, trachea midline and thyroid not enlarged, symmetric, no tenderness/mass/nodules Lungs: clear to auscultation bilaterally Abdomen: soft, non-tender; bowel sounds normal; no masses,  no organomegaly Extremities: extremities normal, atraumatic, no cyanosis or edema   .    Assessment & Plan:  1. Benign paroxysmal positional vertigo Pt has meclizine from er.  Its not helping as much as it did--symptoms overall have improved--? viral - Basic metabolic panel - CBC with Differential - Hepatic function panel - POCT urinalysis dipstick  2. Nausea with vomiting Viral?---- see meds - Basic metabolic panel - CBC with Differential - Hepatic function panel - POCT urinalysis dipstick  3. Hematuria   - Urine Culture

## 2014-02-20 ENCOUNTER — Encounter: Payer: Self-pay | Admitting: Family Medicine

## 2014-02-21 LAB — URINE CULTURE

## 2014-03-03 ENCOUNTER — Ambulatory Visit (INDEPENDENT_AMBULATORY_CARE_PROVIDER_SITE_OTHER): Payer: BC Managed Care – PPO | Admitting: Family Medicine

## 2014-03-03 ENCOUNTER — Encounter: Payer: Self-pay | Admitting: Family Medicine

## 2014-03-03 ENCOUNTER — Telehealth: Payer: Self-pay

## 2014-03-03 VITALS — BP 106/70 | HR 78 | Temp 98.3°F | Wt 127.0 lb

## 2014-03-03 DIAGNOSIS — R42 Dizziness and giddiness: Secondary | ICD-10-CM

## 2014-03-03 MED ORDER — MECLIZINE HCL 25 MG PO TABS: 25.0000 mg | ORAL_TABLET | Freq: Three times a day (TID) | ORAL | Status: DC | PRN

## 2014-03-03 NOTE — Progress Notes (Signed)
  Subjective:     Marilyn Green is a 43 y.o. female who presents for evaluation of dizziness. The symptoms started a few hours ago and are improved. The attacks occur decreasing and lasted 1 hour. Positions that worsen symptoms: any motion and bending over. Previous workup/treatments: blood work: in er and last ov. Associated ear symptoms: none. Associated CNS symptoms: none. Recent infections: none. Head trauma: denied. Drug ingestion: none. Noise exposure: no occupational exposure. Family history: non-contributory.  The following portions of the patient's history were reviewed and updated as appropriate: allergies, current medications, past family history, past medical history, past social history, past surgical history and problem list.  Review of Systems Pertinent items are noted in HPI.    Objective:    BP 106/70  Pulse 78  Temp(Src) 98.3 F (36.8 C) (Oral)  Wt 127 lb (57.607 kg)  SpO2 98%  LMP 01/25/2014 General appearance: alert, cooperative, appears stated age and no distress Ears: normal TM's and external ear canals both ears Nose: Nares normal. Septum midline. Mucosa normal. No drainage or sinus tenderness. Throat: lips, mucosa, and tongue normal; teeth and gums normal Neck: no adenopathy, no carotid bruit, no JVD, supple, symmetrical, trachea midline and thyroid not enlarged, symmetric, no tenderness/mass/nodules Lungs: clear to auscultation bilaterally Heart: S1, S2 normal      Eye-- perla, eomi  Assessment:    Benign positional vertigo    Plan:    Meclizine per medication orders. Referral to Neurology. Educational materials given and questions answered.  Mri/ mra

## 2014-03-03 NOTE — Telephone Encounter (Signed)
Patient was advised that she not drive herself, but have someone else bring her to her appointment today if she continues to experience the dizzy sensation. She stated understanding and agreed.

## 2014-03-03 NOTE — Telephone Encounter (Signed)
States she has been doing better since her last visit on 02/19/14.  However, she experienced a dizzy spell this morning at work after she stood up and bent over.  BP- 120/80, BS- 92.  States she feels better after eating oatmeal. She is currently out of her meclizine.  Took her last dose on Friday of last week.  Patient's symptoms were discussed with Dr. Etter Sjogren and Ronaldo Miyamoto, [place].  Dr. Etter Sjogren recommended that patient come in for a follow-up appointment and stated that she would refill patient's prescription for Meclizine.  Recommendation discussed with patient and patient agreed with plan.  Appointment scheduled for today with Dr. Etter Sjogren at 4 pm.

## 2014-03-03 NOTE — Progress Notes (Signed)
Pre visit review using our clinic review tool, if applicable. No additional management support is needed unless otherwise documented below in the visit note. 

## 2014-03-03 NOTE — Patient Instructions (Signed)

## 2014-03-07 ENCOUNTER — Ambulatory Visit (HOSPITAL_BASED_OUTPATIENT_CLINIC_OR_DEPARTMENT_OTHER)
Admission: RE | Admit: 2014-03-07 | Discharge: 2014-03-07 | Disposition: A | Discharge status: Home / Self Care | Payer: BC Managed Care – PPO | Source: Ambulatory Visit | Attending: Family Medicine | Admitting: Family Medicine

## 2014-03-07 DIAGNOSIS — R42 Dizziness and giddiness: Secondary | ICD-10-CM | POA: Insufficient documentation

## 2014-03-10 ENCOUNTER — Ambulatory Visit (INDEPENDENT_AMBULATORY_CARE_PROVIDER_SITE_OTHER): Payer: BC Managed Care – PPO | Admitting: Neurology

## 2014-03-10 ENCOUNTER — Encounter: Payer: Self-pay | Admitting: Neurology

## 2014-03-10 VITALS — BP 110/80 | HR 85 | Ht 64.57 in | Wt 127.4 lb

## 2014-03-10 DIAGNOSIS — R42 Dizziness and giddiness: Secondary | ICD-10-CM

## 2014-03-10 DIAGNOSIS — G935 Compression of brain: Secondary | ICD-10-CM

## 2014-03-10 NOTE — Patient Instructions (Addendum)
1. MRI cervical, thoracic, and lumbar spine without contrast  [place]  03/21/14 9:30am 2. Vestibular therapy for positional vertigo 3. May use meclizine as needed

## 2014-03-10 NOTE — Progress Notes (Signed)
NEUROLOGY CONSULTATION NOTE  Marilyn Green MRN: [id number] DOB: 1971/02/08  Referring provider: Dr. Garnet Koyanagi Primary care provider: Dr. Garnet Koyanagi  Reason for consult:  Dizziness, abnormal brain MRI  Dear Dr Etter Sjogren:  Thank you for your kind referral of Marilyn Green for consultation of the above symptoms. Although her history is well known to you, please allow me to reiterate it for the purpose of our medical record. Records and images were personally reviewed where available.  HISTORY OF PRESENT ILLNESS: This is a very pleasant 43 year old right-handed woman with a history of catamenial migraines who woke up a month ago very dizzy, with spinning sensation, nausea and vomiting.  She could not walk without fumbling, and would get sick every time she moved.  She was brought to [facility] ER by family, where CBC, CMP, urinalysis, urine pregnancy test were all normal.  She was diagnosed with BPPV with some improvement with meclizine where she was able to walk to the bathroom better and discharged home.  The next day the dizziness had resolved but she still felt that everything was moving in slow motion.  Symptoms eventually resolved until last week at work after bending down and coming back up she started getting lightheaded.  She was unsure if this was because she had not eaten, she sat still and symptoms improved.  She currently denies any further dizziness, but states that her eyes feel weird (heavy and very tired).  She denies any diplopia, blurred vision, no associated headaches, dysarthria, dysphagia, neck/back pain, focal numbness/tingling/weakness, bowel/bladder dysfunction.    She has a history of migraines since her late 51s, usually occurring around the time of her menstrual period.  Headaches are over the bilateral temporal regions with throbbing pain associated with nausea, photo/phonophobia.  Sometimes she just feels the nausea without headache, or at times only has retroorbital  pressure. She takes Relpax with good effect.  She denies any recent falls or infections, but had some nasal congestion with pressure in between the two dizzy episodes.  She denies any ear pain, tinnitus, or ear fullness.  Her mother had vertigo.    She had an MRI brain without contrast which did not show any acute abnormalities.  There was minimal scattered bilateral white matter T2 hyperintensities.  The cerebellar tonsils descend 8 mm below the foramen magnum with mild cerebral spinal fluid effacement, consistent with mild Chiari 1 malformation.   Laboratory Data: Lab Results  Component Value Date   WBC 4.2 02/19/2014   HGB 12.4 02/19/2014   HCT 37.5 02/19/2014   MCV 88.3 02/19/2014   PLT 183.0 02/19/2014     Chemistry      Component Value Date/Time   NA 142 02/19/2014 1119   K 4.5 02/19/2014 1119   CL 108 02/19/2014 1119   CO2 28 02/19/2014 1119   BUN 7 02/19/2014 1119   CREATININE 0.9 02/19/2014 1119      Component Value Date/Time   CALCIUM 9.1 02/19/2014 1119   ALKPHOS 39 02/19/2014 1119   AST 15 02/19/2014 1119   ALT 11 02/19/2014 1119   BILITOT 0.6 02/19/2014 1119      PAST MEDICAL HISTORY: Past Medical History  Diagnosis Date  . Migraine     PAST SURGICAL HISTORY: History reviewed. No pertinent past surgical history.  MEDICATIONS: Current Outpatient Prescriptions on File Prior to Visit  Medication Sig Dispense Refill  . Biotin 1000 MCG tablet Take 1,000 mcg by mouth daily.      . cholecalciferol (  VITAMIN D) 1000 UNITS tablet Take 1,000 Units by mouth daily.      Marland Kitchen ECHINACEA PO Take 1 tablet by mouth daily.      Marland Kitchen levonorgestrel (MIRENA) 20 MCG/24HR IUD 1 each by Intrauterine route once.      . magnesium oxide (MAG-OX) 400 MG tablet Take 400 mg by mouth daily.      . meclizine (ANTIVERT) 25 MG tablet Take 1 tablet (25 mg total) by mouth 3 (three) times daily.  21 tablet  0  . meclizine (ANTIVERT) 25 MG tablet Take 1 tablet (25 mg total) by mouth 3 (three) times daily as  needed for dizziness.  30 tablet  0  . metoCLOPramide (REGLAN) 10 MG tablet Take 10 mg by mouth 4 (four) times daily.      . montelukast (SINGULAIR) 10 MG tablet Take 10 mg by mouth at bedtime.      . ondansetron (ZOFRAN) 4 MG tablet Take 1 tablet (4 mg total) by mouth every 6 (six) hours.  12 tablet  0  . RELPAX 40 MG tablet Take 1 tablet by mouth as needed.      . Riboflavin (B-2) 100 MG TABS Take by mouth.       No current facility-administered medications on file prior to visit.    ALLERGIES: No Known Allergies  FAMILY HISTORY: Family History  Problem Relation Age of Onset  . Hyperlipidemia Mother   . Hypertension Mother     SOCIAL HISTORY: History   Social History  . Marital Status: Single    Spouse Name: N/A    Number of Children: N/A  . Years of Education: N/A   Occupational History  . Not on file.   Social History Main Topics  . Smoking status: Never Smoker   . Smokeless tobacco: Never Used  . Alcohol Use: Yes     Comment: Rarely  . Drug Use: No  . Sexual Activity: Not Currently   Other Topics Concern  . Not on file   Social History Narrative   Exercise- no    REVIEW OF SYSTEMS: Constitutional: No fevers, chills, or sweats, no generalized fatigue, change in appetite Eyes: No visual changes, double vision, eye pain Ear, nose and throat: No hearing loss, ear pain, nasal congestion, sore throat Cardiovascular: No chest pain, palpitations Respiratory:  No shortness of breath at rest or with exertion, wheezes GastrointestinaI: No nausea, vomiting, diarrhea, abdominal pain, fecal incontinence Genitourinary:  No dysuria, urinary retention or frequency Musculoskeletal:  No neck pain, back pain Integumentary: No rash, pruritus, skin lesions Neurological: as above Psychiatric: No depression, insomnia, anxiety Endocrine: No palpitations, fatigue, diaphoresis, mood swings, change in appetite, change in weight, increased thirst Hematologic/Lymphatic:  No anemia,  purpura, petechiae. Allergic/Immunologic: no itchy/runny eyes, nasal congestion, recent allergic reactions, rashes  PHYSICAL EXAM: Filed Vitals:   03/10/14 0757  BP: 110/80  Pulse: 85   General: No acute distress Head:  Normocephalic/atraumatic Eyes: Fundoscopic exam shows bilateral sharp discs, no vessel changes, exudates, or hemorrhages Neck: supple, no paraspinal tenderness, full range of motion Back: No paraspinal tenderness Heart: regular rate and rhythm Lungs: Clear to auscultation bilaterally. Vascular: No carotid bruits. Skin/Extremities: No rash, no edema Neurological Exam: Mental status: alert and oriented to person, place, and time, no dysarthria or aphasia, Fund of knowledge is appropriate.  Recent and remote memory are intact.  Attention and concentration are normal.    Able to name objects and repeat phrases. Cranial nerves: CN I: not tested CN II: pupils  equal, round and reactive to light, visual fields intact, fundi unremarkable. CN III, IV, VI:  full range of motion, no nystagmus, no ptosis CN V: facial sensation intact CN VII: upper and lower face symmetric CN VIII: hearing intact to finger rub CN IX, X: gag intact, uvula midline CN XI: sternocleidomastoid and trapezius muscles intact CN XII: tongue midline Bulk & Tone: normal, no fasciculations. Motor: 5/5 throughout with no pronator drift. Sensation: intact to light touch, cold, pin, vibration and joint position sense.  No extinction to double simultaneous stimulation.  Romberg test negative Deep Tendon Reflexes: brisk +2 throughout with bilateral hyperactive pectoralis reflex, negative Hoffman's sign, no crossed adductor reflex, no ankle clonus Plantar responses: downgoing bilaterally Cerebellar: no incoordination on finger to nose, heel to shin. No dysdiadochokinesia Gait: narrow-based and steady, able to tandem walk adequately. Tremor: none  IMPRESSION: This is a pleasant 43 year old right-handed woman  with a history of catamenial migraines presenting with 2 episodes of dizziness suggestive of positional vertigo.  Neurological exam non-focal except for brisk reflexes.  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  As part of the workup for Chiari, she will have imaging of the spine to assess for syrinx, and will be scheduled for a follow-up brain MRI in a year, unless she starts having a change in symptoms/exam.  She will be referred for vestibular therapy and may take prn meclizine.  She will follow-up in 3 months.  Thank you for allowing me to participate in the care of this patient. Please do not hesitate to call for any questions or concerns.   Ellouise Newer, M.D.

## 2014-03-14 ENCOUNTER — Encounter: Payer: Self-pay | Admitting: Neurology

## 2014-03-19 ENCOUNTER — Telehealth: Payer: Self-pay | Admitting: Neurology

## 2014-03-19 NOTE — Telephone Encounter (Signed)
Pt needs to talk to you please call [phone]

## 2014-03-19 NOTE — Telephone Encounter (Signed)
Returned call to patient following up on rehab schedule

## 2014-03-21 ENCOUNTER — Other Ambulatory Visit: Payer: BC Managed Care – PPO

## 2014-03-25 ENCOUNTER — Ambulatory Visit: Payer: BC Managed Care – PPO | Attending: Neurology | Admitting: Rehabilitative and Restorative Service Providers"

## 2014-03-25 DIAGNOSIS — IMO0001 Reserved for inherently not codable concepts without codable children: Secondary | ICD-10-CM | POA: Insufficient documentation

## 2014-03-25 DIAGNOSIS — G935 Compression of brain: Secondary | ICD-10-CM | POA: Diagnosis not present

## 2014-03-25 DIAGNOSIS — R42 Dizziness and giddiness: Secondary | ICD-10-CM | POA: Diagnosis not present

## 2014-04-08 ENCOUNTER — Ambulatory Visit: Payer: BC Managed Care – PPO | Admitting: Rehabilitative and Restorative Service Providers"

## 2014-04-09 ENCOUNTER — Ambulatory Visit: Payer: BC Managed Care – PPO | Attending: Neurology | Admitting: Rehabilitative and Restorative Service Providers"

## 2014-04-09 DIAGNOSIS — G935 Compression of brain: Secondary | ICD-10-CM | POA: Diagnosis not present

## 2014-04-09 DIAGNOSIS — R42 Dizziness and giddiness: Secondary | ICD-10-CM | POA: Insufficient documentation

## 2014-04-09 DIAGNOSIS — IMO0001 Reserved for inherently not codable concepts without codable children: Secondary | ICD-10-CM | POA: Insufficient documentation

## 2014-08-20 ENCOUNTER — Ambulatory Visit (INDEPENDENT_AMBULATORY_CARE_PROVIDER_SITE_OTHER): Payer: BC Managed Care – PPO

## 2014-08-20 DIAGNOSIS — Z23 Encounter for immunization: Secondary | ICD-10-CM

## 2014-08-20 NOTE — Progress Notes (Signed)
Pt tolerated injection well.  No signs of reaction upon leaving the clinic.

## 2014-08-20 NOTE — Progress Notes (Signed)
Pre visit review using our clinic review tool, if applicable. No additional management support is needed unless otherwise documented below in the visit note. 

## 2014-10-09 ENCOUNTER — Encounter: Payer: Self-pay | Admitting: Family Medicine

## 2015-07-22 ENCOUNTER — Ambulatory Visit (INDEPENDENT_AMBULATORY_CARE_PROVIDER_SITE_OTHER): Payer: BLUE CROSS/BLUE SHIELD

## 2015-07-22 DIAGNOSIS — Z23 Encounter for immunization: Secondary | ICD-10-CM

## 2016-07-18 ENCOUNTER — Ambulatory Visit (INDEPENDENT_AMBULATORY_CARE_PROVIDER_SITE_OTHER): Payer: BLUE CROSS/BLUE SHIELD

## 2016-07-18 DIAGNOSIS — Z23 Encounter for immunization: Secondary | ICD-10-CM | POA: Diagnosis not present

## 2016-08-07 ENCOUNTER — Encounter: Payer: Self-pay | Admitting: Family Medicine

## 2016-08-07 NOTE — Telephone Encounter (Signed)
Sounds like she may need an appointment

## 2016-08-22 ENCOUNTER — Ambulatory Visit (INDEPENDENT_AMBULATORY_CARE_PROVIDER_SITE_OTHER): Payer: BLUE CROSS/BLUE SHIELD | Admitting: Family Medicine

## 2016-08-22 ENCOUNTER — Encounter: Payer: Self-pay | Admitting: Family Medicine

## 2016-08-22 VITALS — BP 110/78 | HR 74 | Temp 98.3°F | Resp 16 | Ht 64.0 in | Wt 114.6 lb

## 2016-08-22 DIAGNOSIS — M25552 Pain in left hip: Secondary | ICD-10-CM | POA: Insufficient documentation

## 2016-08-22 DIAGNOSIS — Q0700 Arnold-Chiari syndrome without spina bifida or hydrocephalus: Secondary | ICD-10-CM

## 2016-08-22 DIAGNOSIS — M79642 Pain in left hand: Secondary | ICD-10-CM | POA: Insufficient documentation

## 2016-08-22 DIAGNOSIS — G935 Compression of brain: Secondary | ICD-10-CM | POA: Diagnosis not present

## 2016-08-22 NOTE — Assessment & Plan Note (Signed)
With numbness and tingling to foot Xray F/u neuro

## 2016-08-22 NOTE — Progress Notes (Signed)
Patient ID: Marilyn Green, female    DOB: March 19, 1971  Age: 45 y.o. MRN: [id number]    Subjective:  Subjective  HPI Marilyn Green presents for L hip pain x since August.  It is worse in am and improves as the day goes on.  No injury + L foot pain --- lateral 5th toe.  No pain to walk, no known injury.  She felt a shock sensation x few weeks ago while walking on her toes at night.   Pt also c/o L thumb pain she noticed while using steering wheel.   Neuro had told her to return if numbness developed per pt.  No acute injury Review of Systems  Constitutional: Negative for activity change, appetite change, fatigue and unexpected weight change.  Respiratory: Negative for cough and shortness of breath.   Cardiovascular: Negative for chest pain and palpitations.  Psychiatric/Behavioral: Negative for behavioral problems and dysphoric mood. The patient is not nervous/anxious.     History Past Medical History:  Diagnosis Date  . Migraine     She has no past surgical history on file.   Her family history includes Hyperlipidemia in her mother; Hypertension in her mother.She reports that she has never smoked. She has never used smokeless tobacco. She reports that she drinks alcohol. She reports that she does not use drugs.  Current Outpatient Prescriptions on File Prior to Visit  Medication Sig Dispense Refill  . Biotin 1000 MCG tablet Take 1,000 mcg by mouth daily.    . cholecalciferol (VITAMIN D) 1000 UNITS tablet Take 1,000 Units by mouth daily.    Marland Kitchen ECHINACEA PO Take 1 tablet by mouth daily.    Marland Kitchen levonorgestrel (MIRENA) 20 MCG/24HR IUD 1 each by Intrauterine route once.    . meclizine (ANTIVERT) 25 MG tablet Take 1 tablet (25 mg total) by mouth 3 (three) times daily as needed for dizziness. 30 tablet 0  . metoCLOPramide (REGLAN) 10 MG tablet Take 10 mg by mouth 4 (four) times daily.    . ondansetron (ZOFRAN) 4 MG tablet Take 1 tablet (4 mg total) by mouth every 6 (six) hours. 12 tablet 0    . RELPAX 40 MG tablet Take 1 tablet by mouth as needed.    . magnesium oxide (MAG-OX) 400 MG tablet Take 400 mg by mouth daily.    . montelukast (SINGULAIR) 10 MG tablet Take 10 mg by mouth at bedtime.    . Riboflavin (B-2) 100 MG TABS Take by mouth.     No current facility-administered medications on file prior to visit.      Objective:  Objective  Physical Exam  Constitutional: She is oriented to person, place, and time. She appears well-developed and well-nourished.  HENT:  Head: Normocephalic and atraumatic.  Eyes: Conjunctivae and EOM are normal.  Neck: Normal range of motion. Neck supple. No JVD present. Carotid bruit is not present. No thyromegaly present.  Cardiovascular: Normal rate, regular rhythm and normal heart sounds.   No murmur heard. Pulmonary/Chest: Effort normal and breath sounds normal. No respiratory distress. She has no wheezes. She has no rales. She exhibits no tenderness.  Musculoskeletal: She exhibits no edema.  Neurological: She is alert and oriented to person, place, and time.  Psychiatric: She has a normal mood and affect. Her behavior is normal.  Nursing note and vitals reviewed.  BP 110/78 (BP Location: Left Arm, Patient Position: Sitting, Cuff Size: Normal)   Pulse 74   Temp 98.3 F (36.8 C) (Oral)   Resp 16  Ht 5\' 4"  (1.626 m)   Wt 114 lb 9.6 oz (52 kg)   SpO2 99%   BMI 19.67 kg/m  Wt Readings from Last 3 Encounters:  08/22/16 114 lb 9.6 oz (52 kg)  03/10/14 127 lb 6 oz (57.8 kg)  03/03/14 127 lb (57.6 kg)     Lab Results  Component Value Date   WBC 4.2 02/19/2014   HGB 12.4 02/19/2014   HCT 37.5 02/19/2014   PLT 183.0 02/19/2014   GLUCOSE 78 02/19/2014   CHOL 196 08/13/2013   TRIG 40.0 08/13/2013   HDL 61.30 08/13/2013   LDLCALC 127 (H) 08/13/2013   ALT 11 02/19/2014   AST 15 02/19/2014   NA 142 02/19/2014   K 4.5 02/19/2014   CL 108 02/19/2014   CREATININE 0.9 02/19/2014   BUN 7 02/19/2014   CO2 28 02/19/2014   TSH 1.99  08/13/2013    Mr Brain Wo Contrast  Result Date: 03/08/2014 CLINICAL DATA:  Dizziness for 3 weeks. EXAM: MRI HEAD WITHOUT CONTRAST TECHNIQUE: Multiplanar, multiecho pulse sequences of the brain and surrounding structures were obtained without intravenous contrast. COMPARISON:  None available for comparison at time of study interpretation. FINDINGS: The ventricles and sulci are normal for patient's age. No abnormal parenchymal signal, mass lesions, mass effect. A few subcentimeter (measuring up to 3 mm) supratentorial white matter T2 hyperintensities are nonspecific. No reduced diffusion to suggest acute ischemia. No susceptibility artifact to suggest hemorrhage. No abnormal extra-axial fluid collections. No extra-axial masses though, contrast enhanced sequences would be more sensitive. Normal major intracranial vascular flow voids seen at the skull base. Ocular globes and orbital contents are unremarkable though not tailored for evaluation. No abnormal sellar expansion. Tiny bilaterally maxillary mucosal retention cysts without paranasal sinus air-fluid levels. No suspicious calvarial bone marrow signal. No abnormal sellar expansion. Cerebellar tonsils descend 8 mm below the foramen magnum with mild cerebral spinal fluid effacement. IMPRESSION: No acute intracranial process. Mild Chiari 1 malformation. No corroborative findings of intracranial hypotension though, contrast-enhanced sequences would be more sensitive. Mild white matter changes are nonspecific, can be seen with migraines, chronic small vessel ischemic disease, prior head trauma. Recommend clinical correlation. Electronically Signed   By: Elon Alas   On: 03/08/2014 05:36     Assessment & Plan:  Plan  I am having Ms. Schrack maintain her RELPAX, ECHINACEA PO, Biotin, cholecalciferol, magnesium oxide, metoCLOPramide, B-2, levonorgestrel, montelukast, ondansetron, and meclizine.  No orders of the defined types were placed in this  encounter.   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  Ann Held, DO

## 2016-08-22 NOTE — Assessment & Plan Note (Signed)
Thumb spica splint from phamacy-- consider ortho

## 2016-08-22 NOTE — Assessment & Plan Note (Signed)
F/u neuro  

## 2016-08-22 NOTE — Patient Instructions (Signed)
Chiari Malformation Introduction Chiari malformation (CM) is a type of brain abnormality that involves the parts of your brain that are important for balance (cerebellum) and basic body functions (brain stem). Normally, the cerebellum is located in a space at the back part of the skull, just above the opening for the spinal cord (foramen magnum). With CM, part of the cerebellum is located below the foramen magnum instead. This can cause neck pain, balance problems, and other symptoms. There are five types of CM:  Type I.  This is the most common type. It can cause cerebrospinal fluid (CSF) to not flow between your brain and spinal cord as it normally should.  This type may not cause symptoms, and it often goes unnoticed.  Type II.  This type is present at birth (congenital), and it usually involves a condition in which the spine does not form properly (spina bifida).  Type II also involves having a larger portion of brain structures move down and push through (herniate) the foramen magnum into the spinal canal.  Type III.  This type is more severe than Types I and II, and it is the least common type.  Type III often occurs with a type of congenital disability in which an opening in the skull causes the cerebellum and brain stem and their coverings to bulge out in a sac (encephalocele).  Type IV.  This is also a severe form of CM.  Part of the cerebellum may be missing, and parts of the spine and skull may be visible.  Type 0.  In this type, the cerebellum does not herniate into or below the foramen magnum. However, abnormal flow of CSF between the brain and spinal cord creates a collection of fluid inside the spinal cord (syrinx). This may cause symptoms. What are the causes? CM is most commonly congenital. CM can occur when the skull forms incorrectly and provides less space for the cerebellum than normal. In rare cases, CM may also develop later in life (acquired CM or secondary  CM). These cases may be caused by:  Injury.  Infection. Abnormal structure or pressure develops in the brain, and this pushes the cerebellum down into the foramen magnum. What increases the risk? Congenital CM is more likely to occur in:  Females.  People who have a family history of CM. What are the signs or symptoms? CM may not cause any symptoms. Symptoms may also vary depending on the type and severity of CM. Symptoms may also come and go. The most common symptom is a severe headache in the back of the head. Headache pain may come and go. It may also spread to your neck and shoulders. The pain may be worse when you cough, sneeze, or strain. Other symptoms include:  Difficulty balancing.  Loss of coordination.  Trouble swallowing or speaking.  Muscle weakness.  Feeling dizzy.  Ringing in the ears.  Fainting.  Trouble sleeping.  Fatigue.  Tingling or burning sensations in the fingers or toes.  Hearing problems.  Vision problems.  Vomiting.  Depression.  Seizures. These are only present in more severe types of CM. How is this diagnosed? This condition may be diagnosed with a medical history and physical exam. This may include tests to check your balance and your memory. You may also have other tests, including:  X-ray.  CT scan.  MRI. How is this treated? Treatment for this condition depends on your symptoms and the type of CM that you have. If you have headaches or  neck pain, you may be treated with pain medicine or massage therapy. If you have symptoms of CM that are severe or are getting worse, your health care provider may recommend surgery to control your symptoms and prevent the malformation from getting worse. If you do not have symptoms, you may not need treatment. Follow these instructions at home:  Take medicines only as directed by your health care provider.  Avoid activities that involve straining and heavy lifting.  Consider joining a CM  support group.  Keep all follow-up visits as directed by your health care provider. This is important. Contact a health care provider if:  You have new symptoms.  Your symptoms get worse. Get help right away if:  You have seizures that are new or different than before. This information is not intended to replace advice given to you by your health care provider. Make sure you discuss any questions you have with your health care provider. Document Released: 09/08/2002 Document Revised: 02/24/2016 Document Reviewed: 05/27/2014  2017 [employer]vier

## 2016-08-22 NOTE — Progress Notes (Signed)
Pre visit review using our clinic review tool, if applicable. No additional management support is needed unless otherwise documented below in the visit note. 

## 2016-08-28 ENCOUNTER — Ambulatory Visit (INDEPENDENT_AMBULATORY_CARE_PROVIDER_SITE_OTHER)
Admission: RE | Admit: 2016-08-28 | Discharge: 2016-08-28 | Disposition: A | Discharge status: Home / Self Care | Payer: BLUE CROSS/BLUE SHIELD | Source: Ambulatory Visit | Attending: Family Medicine | Admitting: Family Medicine

## 2016-08-28 DIAGNOSIS — M25552 Pain in left hip: Secondary | ICD-10-CM

## 2016-08-29 ENCOUNTER — Encounter: Payer: Self-pay | Admitting: Neurology

## 2016-08-29 ENCOUNTER — Ambulatory Visit (INDEPENDENT_AMBULATORY_CARE_PROVIDER_SITE_OTHER): Payer: BLUE CROSS/BLUE SHIELD | Admitting: Neurology

## 2016-08-29 VITALS — BP 108/74 | HR 86 | Ht 64.0 in | Wt 114.3 lb

## 2016-08-29 DIAGNOSIS — M79672 Pain in left foot: Secondary | ICD-10-CM | POA: Diagnosis not present

## 2016-08-29 DIAGNOSIS — G5602 Carpal tunnel syndrome, left upper limb: Secondary | ICD-10-CM

## 2016-08-29 NOTE — Patient Instructions (Signed)
1. Start wearing wrist splint on the left  2. Refer to Podiatry for foot pain 3. Call our office in a month to update on how you are feeling with the left hand. If still bothering you, we will schedule the nerve test 4. Follow-up in 3 months

## 2016-08-29 NOTE — Progress Notes (Signed)
NEUROLOGY FOLLOW UP OFFICE NOTE  Marilyn Green [id number]  HISTORY OF PRESENT ILLNESS: I had the pleasure of seeing Marilyn Green in follow-up in the neurology clinic on 08/29/2016.  The patient was last seen 2 years ago when she presented for vertigo. This has resolved. 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 She presents today with new symptoms of pain and tingling in her left thumb and pain in the left 5th digit. She is unsure when symptoms started, probably a couple of weeks ago, she noticed the left hand symptoms when she was turning the steering wheel or when she holds something a certain way, she would have localized pain and tingling down the left thumb. She denies any recent heavy lifting but does endorse frequent typing and symptoms occurring when she wakes up from sleep. She has also started to notice pain in the left 5th digit, initially she was walking up steps one night and thought she stepped on something, but since then everytime she touches her pinky toe, it would be painful. She does not recall any trauma to her foot. She denies any neck or back pain. Right side is unaffected. She has "regular" headaches occurring infrequently, and has been having some dizziness which she feels may be sinus-related. She would wake up with nasal congestion.   HPI 03/10/2014: This is a very pleasant 45 yo RH woman with a history of catamenial migraines who woke up a month ago very dizzy, with spinning sensation, nausea and vomiting.  She could not walk without fumbling, and would get sick every time she moved.  She was brought to [facility] ER by family, where CBC, CMP, urinalysis, urine pregnancy test were all normal.  She was diagnosed with BPPV with some improvement with meclizine where she was able to walk to the bathroom better and discharged home.  The next day the dizziness had resolved but she still felt that everything was moving in slow motion.  Symptoms eventually resolved  until last week at work after bending down and coming back up she started getting lightheaded.  She was unsure if this was because she had not eaten, she sat still and symptoms improved.  She currently denies any further dizziness, but states that her eyes feel weird (heavy and very tired).  She denies any diplopia, blurred vision, no associated headaches, dysarthria, dysphagia, neck/back pain, focal numbness/tingling/weakness, bowel/bladder dysfunction.    She has a history of migraines since her late 67s, usually occurring around the time of her menstrual period.  Headaches are over the bilateral temporal regions with throbbing pain associated with nausea, photo/phonophobia.  Sometimes she just feels the nausea without headache, or at times only has retroorbital pressure. She takes Relpax with good effect.  She denies any recent falls or infections, but had some nasal congestion with pressure in between the two dizzy episodes.  She denies any ear pain, tinnitus, or ear fullness.  Her mother had vertigo.    She had an MRI brain without contrast which did not show any acute abnormalities.  There was minimal scattered bilateral white matter T2 hyperintensities.  The cerebellar tonsils descend 8 mm below the foramen magnum with mild cerebral spinal fluid effacement, consistent with mild Chiari 1 malformation.   PAST MEDICAL HISTORY: Past Medical History:  Diagnosis Date  . Migraine     MEDICATIONS: Current Outpatient Prescriptions on File Prior to Visit  Medication Sig Dispense Refill  . Biotin 1000 MCG tablet Take 1,000  mcg by mouth daily.    . cholecalciferol (VITAMIN D) 1000 UNITS tablet Take 1,000 Units by mouth daily.    Marland Kitchen ECHINACEA PO Take 1 tablet by mouth daily.    Marland Kitchen levonorgestrel (MIRENA) 20 MCG/24HR IUD 1 each by Intrauterine route once.    . magnesium oxide (MAG-OX) 400 MG tablet Take 400 mg by mouth daily.    . meclizine (ANTIVERT) 25 MG tablet Take 1 tablet (25 mg total) by mouth 3  (three) times daily as needed for dizziness. 30 tablet 0  . metoCLOPramide (REGLAN) 10 MG tablet Take 10 mg by mouth 4 (four) times daily.    . RELPAX 40 MG tablet Take 1 tablet by mouth as needed.    . Riboflavin (B-2) 100 MG TABS Take by mouth.    . montelukast (SINGULAIR) 10 MG tablet Take 10 mg by mouth at bedtime.     No current facility-administered medications on file prior to visit.     ALLERGIES: No Known Allergies  FAMILY HISTORY: Family History  Problem Relation Age of Onset  . Hyperlipidemia Mother   . Hypertension Mother     SOCIAL HISTORY: Social History   Social History  . Marital status: Single    Spouse name: N/A  . Number of children: N/A  . Years of education: N/A   Occupational History  . Not on file.   Social History Main Topics  . Smoking status: Never Smoker  . Smokeless tobacco: Never Used  . Alcohol use Yes     Comment: Rarely  . Drug use: No  . Sexual activity: Not Currently   Other Topics Concern  . Not on file   Social History Narrative   Exercise- no    REVIEW OF SYSTEMS: Constitutional: No fevers, chills, or sweats, no generalized fatigue, change in appetite Eyes: No visual changes, double vision, eye pain Ear, nose and throat: No hearing loss, ear pain, nasal congestion, sore throat Cardiovascular: No chest pain, palpitations Respiratory:  No shortness of breath at rest or with exertion, wheezes GastrointestinaI: No nausea, vomiting, diarrhea, abdominal pain, fecal incontinence Genitourinary:  No dysuria, urinary retention or frequency Musculoskeletal:  No neck pain, back pain Integumentary: No rash, pruritus, skin lesions Neurological: as above Psychiatric: No depression, insomnia, anxiety Endocrine: No palpitations, fatigue, diaphoresis, mood swings, change in appetite, change in weight, increased thirst Hematologic/Lymphatic:  No anemia, purpura, petechiae. Allergic/Immunologic: no itchy/runny eyes, nasal congestion, recent  allergic reactions, rashes  PHYSICAL EXAM: Vitals:   08/29/16 1341  BP: 108/74  Pulse: 86   General: No acute distress Head:  Normocephalic/atraumatic Neck: supple, no paraspinal tenderness, full range of motion Heart:  Regular rate and rhythm Lungs:  Clear to auscultation bilaterally Back: No paraspinal tenderness Skin/Extremities: No rash, no edema. She has pain on the base of her left 5th toe, with shooting pain on palpation Neurological Exam: alert and oriented to person, place, and time. No aphasia or dysarthria. Fund of knowledge is appropriate.  Recent and remote memory are intact.  Attention and concentration are normal.    Able to name objects and repeat phrases. Cranial nerves: Pupils equal, round, reactive to light.  Extraocular movements intact with no nystagmus. Visual fields full. Facial sensation intact. No facial asymmetry. Tongue, uvula, palate midline.  Motor: Bulk and tone normal, muscle strength 5/5 throughout with no pronator drift.  Sensation: decreased on the left thenar palmar region to cold and pin. No extinction to double simultaneous stimulation.  Deep tendon reflexes brisk 2+ throughout,  toes downgoing, negative Hoffman sign.  Finger to nose testing intact.  Gait narrow-based and steady, able to tandem walk adequately.  Romberg negative. Negative Tinel and Phalen's sign.  IMPRESSION: This is a pleasant 45 yo RH woman who I had initially seen for positional vertigo, MRI at that time had shown mild Chiari I malformation that she is asymptomatic from. She presents today with new symptoms of left thumb pain and tingling, as well as left 5th toe pain. Her neurological exam is overall unremarkable, she does have some decreased sensation on the thenar region of the left hand, negative Tinel and Phalen sign. This may be due to carpal tunnel syndrome, she will try wearing a wrist splint over the next month. If symptoms persist, EMG/NCV of the left UE will be ordered. The pain in  her left 5th digit is more at the base of the toe, possibly ?tendinitis/fasciitis? and will be referred to Podiatry. She will follow-up in 3 months.  Thank you for allowing me to participate in her care.  Please do not hesitate to call for any questions or concerns.  The duration of this appointment visit was 25 minutes of face-to-face time with the patient.  Greater than 50% of this time was spent in counseling, explanation of diagnosis, planning of further management, and coordination of care.   Ellouise Newer, M.D.   CC: Dr. Cheri Rous

## 2016-09-04 ENCOUNTER — Encounter: Payer: Self-pay | Admitting: *Deleted

## 2016-09-05 ENCOUNTER — Ambulatory Visit (INDEPENDENT_AMBULATORY_CARE_PROVIDER_SITE_OTHER): Payer: BLUE CROSS/BLUE SHIELD

## 2016-09-05 ENCOUNTER — Encounter: Payer: Self-pay | Admitting: Podiatry

## 2016-09-05 ENCOUNTER — Ambulatory Visit (INDEPENDENT_AMBULATORY_CARE_PROVIDER_SITE_OTHER): Payer: BLUE CROSS/BLUE SHIELD | Admitting: Podiatry

## 2016-09-05 DIAGNOSIS — M79675 Pain in left toe(s): Secondary | ICD-10-CM | POA: Diagnosis not present

## 2016-09-05 DIAGNOSIS — G5792 Unspecified mononeuropathy of left lower limb: Secondary | ICD-10-CM | POA: Diagnosis not present

## 2016-09-05 NOTE — Progress Notes (Signed)
   Subjective:    Patient ID: Marilyn Green, female    DOB: [birth year], 45 y.o.   MRN: [id number]  HPI:: She presents today as a 45 year old female with a chief complaint of pain to the fifth digit of the left foot around the fifth metatarsophalangeal joint area she states that she gets sharp stabbing sensations almost as she is stepping on something and rolling beneath the knuckle. She states that it is very uncomfortable and it has started to develop a corn on the fifth digit of the left foot.    Review of Systems  All other systems reviewed and are negative.      Objective:   Physical Exam: Vital signs are stable alert and oriented 3 pulses are palpable no apparent distress. Pulses are normal with capillary fill time. Neurologic sensorium is intact deep tendon reflexes are intact muscle strength +5 over 5 dorsiflexion plantar flexors and inverters everters all his musculatures intact. Orthopedic evaluation demonstrates all joints distal to the ankle for range of motion without crepitation. There she does have tenderness on palpation with what appears to be an enlarged nerve on the plantar lateral aspect of the fifth metatarsal phalangeal joint. 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  Plan: Discussed etiology pathology conservative versus surgical therapies. Injected the area today with dexamethasone and local anesthetic after thorough discussion. We also discussed appropriate shoe gear and sizing of that shoe gear and the pressure that that puts on this area. She understands this and is amenable to it and I  will follow-up with her in the near future.

## 2016-09-11 ENCOUNTER — Telehealth: Payer: Self-pay | Admitting: *Deleted

## 2016-09-11 NOTE — Telephone Encounter (Signed)
Pt states she received an cortisone injection Tuesday of last week and is still having pain. Left message informing pt that often more than one injection is necessary to fight the inflammation, that the injection has a short and long acting anesthetic and the cortisone for the inflammation. I told pt to begin ice therapy 15-20 minutes on ice pack with foot protected with sock or cloth for 3-4 times a day, rest and if she was able to tolerate OTC Ibuprofen or Aleve to take as package directs and make another appt for possibly another injection, and to call again with questions.

## 2016-10-11 ENCOUNTER — Other Ambulatory Visit: Payer: Self-pay

## 2016-10-11 ENCOUNTER — Telehealth: Payer: Self-pay | Admitting: Neurology

## 2016-10-11 DIAGNOSIS — G5602 Carpal tunnel syndrome, left upper limb: Secondary | ICD-10-CM

## 2016-10-11 NOTE — Telephone Encounter (Signed)
Notified patient. Order in for EMG patient transferred to front to schedule EMG.

## 2016-10-11 NOTE — Telephone Encounter (Signed)
If still having symptoms, let's proceed with EMG/NCV of the left UE for carpal tunnel syndrome. Thanks!

## 2016-10-11 NOTE — Telephone Encounter (Signed)
Please advise 

## 2016-10-11 NOTE — Telephone Encounter (Signed)
Marilyn Green [birth year]. Her # [id number]. She was seen for Carpal Tunnel. She has been wearing a splint. She said it is better but she  still can feel it sometimes. She was told to do a follow up phone call to let Dr. Delice Lesch know in a month how she was doing and to see if a nerve study was needed? She does have a follow up with Dr. Delice Lesch on 11/23/16. Thank you

## 2016-10-12 ENCOUNTER — Ambulatory Visit (INDEPENDENT_AMBULATORY_CARE_PROVIDER_SITE_OTHER): Payer: BLUE CROSS/BLUE SHIELD | Admitting: Neurology

## 2016-10-12 DIAGNOSIS — R202 Paresthesia of skin: Secondary | ICD-10-CM

## 2016-10-12 DIAGNOSIS — G5602 Carpal tunnel syndrome, left upper limb: Secondary | ICD-10-CM

## 2016-10-12 NOTE — Procedures (Signed)
[facility]  [address]  [place], [place] [address] Tel: [phone] Fax:  [phone] Test Date:  10/12/2016  Patient: Marilyn Green DOB: [birth year] Physician: Narda Amber, DO  Sex: Female Height: 5\' 4"  Ref Phys: Ellouise Newer, M.D.  ID#: [id number] Temp: 33.4C Technician: Jerilynn Mages. Dean   Patient Complaints: This is a 46 year old female referred for evaluation of left hand paresthesias, especially over the thumb.   NCV & EMG Findings: Extensive electrodiagnostic testing of the left upper extremity shows:  1. Left median, ulnar, and mixed palmar sensory responses are within normal limits. 2. Left median and ulnar motor responses are within normal limits. 3. There is no evidence of active or chronic motor axon loss changes affecting any of the tested muscles. Motor unit configuration and recruitment pattern is within normal limits.  Impression: This is a normal study of the left upper extremity. In particular, there is no evidence of a cervical radiculopathy or carpal tunnel syndrome.   ___________________________ Narda Amber, DO    Nerve Conduction Studies Anti Sensory Summary Table   Site NR Peak (ms) Norm Peak (ms) P-T Amp (V) Norm P-T Amp  Left Median Anti Sensory (2nd Digit)  Wrist    2.8 <3.4 46.3 >20  Left Ulnar Anti Sensory (5th Digit)  Wrist    3.0 <3.1 31.9 >12   Motor Summary Table   Site NR Onset (ms) Norm Onset (ms) O-P Amp (mV) Norm O-P Amp Site1 Site2 Delta-0 (ms) Dist (cm) Vel (m/s) Norm Vel (m/s)  Left Median Motor (Abd Poll Brev)  Wrist    3.1 <3.9 9.8 >6 Elbow Wrist 4.0 24.0 60 >50  Elbow    7.1  8.7         Left Ulnar Motor (Abd Dig Minimi)  Wrist    2.3 <3.1 9.9 >7 B Elbow Wrist 3.1 18.0 58 >50  B Elbow    5.4  9.7  A Elbow B Elbow 1.6 10.0 63 >50  A Elbow    7.0  9.4          Comparison Summary Table   Site NR Peak (ms) Norm Peak (ms) P-T Amp (V) Site1 Site2 Delta-P (ms) Norm Delta (ms)  Left Median/Ulnar Palm  Comparison (Wrist - 8cm)  Median Palm    1.8 <2.2 61.7 Median Palm Ulnar Palm 0.1   Ulnar Palm    1.9 <2.2 30.9       EMG   Side Muscle Ins Act Fibs Psw Fasc Number Recrt Dur Dur. Amp Amp. Poly Poly. Comment  Left 1stDorInt Nml Nml Nml Nml Nml Nml Nml Nml Nml Nml Nml Nml N/A  Left Ext Indicis Nml Nml Nml Nml Nml Nml Nml Nml Nml Nml Nml Nml N/A  Left PronatorTeres Nml Nml Nml Nml Nml Nml Nml Nml Nml Nml Nml Nml N/A  Left Biceps Nml Nml Nml Nml Nml Nml Nml Nml Nml Nml Nml Nml N/A  Left Triceps Nml Nml Nml Nml Nml Nml Nml Nml Nml Nml Nml Nml N/A  Left Deltoid Nml Nml Nml Nml Nml Nml Nml Nml Nml Nml Nml Nml N/A      Waveforms:

## 2016-10-23 ENCOUNTER — Telehealth: Payer: Self-pay

## 2016-10-23 NOTE — Telephone Encounter (Signed)
Patient notified.  Verbalized understanding. 

## 2016-10-23 NOTE — Telephone Encounter (Signed)
-----   Message from Cameron Sprang, MD sent at 10/23/2016 10:13 AM EST ----- Pls let her know the nerve test is normal, no permanent damage seen, no pinched nerve seen. Continue wearing wrist splint. Thanks

## 2016-11-08 DIAGNOSIS — N3020 Other chronic cystitis without hematuria: Secondary | ICD-10-CM | POA: Diagnosis not present

## 2016-11-08 DIAGNOSIS — R3129 Other microscopic hematuria: Secondary | ICD-10-CM | POA: Diagnosis not present

## 2016-11-23 ENCOUNTER — Encounter: Payer: Self-pay | Admitting: Neurology

## 2016-11-23 ENCOUNTER — Ambulatory Visit (INDEPENDENT_AMBULATORY_CARE_PROVIDER_SITE_OTHER): Payer: BLUE CROSS/BLUE SHIELD | Admitting: Neurology

## 2016-11-23 VITALS — BP 110/76 | HR 83 | Ht 64.0 in | Wt 119.6 lb

## 2016-11-23 DIAGNOSIS — R202 Paresthesia of skin: Secondary | ICD-10-CM

## 2016-11-23 DIAGNOSIS — R200 Anesthesia of skin: Secondary | ICD-10-CM | POA: Diagnosis not present

## 2016-11-23 DIAGNOSIS — M6740 Ganglion, unspecified site: Secondary | ICD-10-CM

## 2016-11-23 NOTE — Patient Instructions (Signed)
1. Refer to Dr. Tamala Julian for right hand cyst with tingling 2. Follow-up on as needed basis, call for any changes

## 2016-11-23 NOTE — Progress Notes (Signed)
NEUROLOGY FOLLOW UP OFFICE NOTE  Kathlyne Jakus [id number]  HISTORY OF PRESENT ILLNESS: I had the pleasure of seeing Kamarah Dopson in follow-up in the neurology clinic on 11/23/2016.  The patient was last seen 3 months ago when she presented for vertigo. This has resolved. 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 She presented in November 2017 with new symptoms of pain and tingling in her left thumb and pain in the left 5th digit of her foot. She had an EMG/NCV of the left UE which was normal, no evidence of carpal tunnel or radiculopathy. She states the symptoms on her left hand had resolved, but now she has a small bump on the dorsum of her right hand and every time she touches it, she has tingling going down between the 3rd and 4th digits. If she does not touch it, she is asymptomatic. She continues to have pain in her foot even after a cortisone shot with Podiatry.   HPI 03/10/2014: This is a very pleasant 46 yo RH woman with a history of catamenial migraines who woke up a month ago very dizzy, with spinning sensation, nausea and vomiting.  She could not walk without fumbling, and would get sick every time she moved.  She was brought to [facility] ER by family, where CBC, CMP, urinalysis, urine pregnancy test were all normal.  She was diagnosed with BPPV with some improvement with meclizine where she was able to walk to the bathroom better and discharged home.  The next day the dizziness had resolved but she still felt that everything was moving in slow motion.  Symptoms eventually resolved until last week at work after bending down and coming back up she started getting lightheaded.  She was unsure if this was because she had not eaten, she sat still and symptoms improved.  She currently denies any further dizziness, but states that her eyes feel weird (heavy and very tired).  She denies any diplopia, blurred vision, no associated headaches, dysarthria, dysphagia, neck/back pain,  focal numbness/tingling/weakness, bowel/bladder dysfunction.    She has a history of migraines since her late 38s, usually occurring around the time of her menstrual period.  Headaches are over the bilateral temporal regions with throbbing pain associated with nausea, photo/phonophobia.  Sometimes she just feels the nausea without headache, or at times only has retroorbital pressure. She takes Relpax with good effect.  She denies any recent falls or infections, but had some nasal congestion with pressure in between the two dizzy episodes.  She denies any ear pain, tinnitus, or ear fullness.  Her mother had vertigo.    She had an MRI brain without contrast which did not show any acute abnormalities.  There was minimal scattered bilateral white matter T2 hyperintensities.  The cerebellar tonsils descend 8 mm below the foramen magnum with mild cerebral spinal fluid effacement, consistent with mild Chiari 1 malformation.   PAST MEDICAL HISTORY: Past Medical History:  Diagnosis Date  . Migraine     MEDICATIONS: Current Outpatient Prescriptions on File Prior to Visit  Medication Sig Dispense Refill  . Biotin 1000 MCG tablet Take 1,000 mcg by mouth daily.    . cholecalciferol (VITAMIN D) 1000 UNITS tablet Take 1,000 Units by mouth daily.    Marland Kitchen ECHINACEA PO Take 1 tablet by mouth daily.    Marland Kitchen levonorgestrel (MIRENA) 20 MCG/24HR IUD 1 each by Intrauterine route once.    . magnesium oxide (MAG-OX) 400 MG tablet Take 400 mg  by mouth daily.    . meclizine (ANTIVERT) 25 MG tablet Take 1 tablet (25 mg total) by mouth 3 (three) times daily as needed for dizziness. 30 tablet 0  . metoCLOPramide (REGLAN) 10 MG tablet Take 10 mg by mouth 4 (four) times daily.    . montelukast (SINGULAIR) 10 MG tablet Take 10 mg by mouth at bedtime.    . RELPAX 40 MG tablet Take 1 tablet by mouth as needed.    . Riboflavin (B-2) 100 MG TABS Take by mouth.     No current facility-administered medications on file prior to  visit.     ALLERGIES: No Known Allergies  FAMILY HISTORY: Family History  Problem Relation Age of Onset  . Hyperlipidemia Mother   . Hypertension Mother     SOCIAL HISTORY: Social History   Social History  . Marital status: Single    Spouse name: N/A  . Number of children: N/A  . Years of education: N/A   Occupational History  . Not on file.   Social History Main Topics  . Smoking status: Never Smoker  . Smokeless tobacco: Never Used  . Alcohol use Yes     Comment: Rarely  . Drug use: No  . Sexual activity: Not Currently   Other Topics Concern  . Not on file   Social History Narrative   Exercise- no    REVIEW OF SYSTEMS: Constitutional: No fevers, chills, or sweats, no generalized fatigue, change in appetite Eyes: No visual changes, double vision, eye pain Ear, nose and throat: No hearing loss, ear pain, nasal congestion, sore throat Cardiovascular: No chest pain, palpitations Respiratory:  No shortness of breath at rest or with exertion, wheezes GastrointestinaI: No nausea, vomiting, diarrhea, abdominal pain, fecal incontinence Genitourinary:  No dysuria, urinary retention or frequency Musculoskeletal:  No neck pain, back pain Integumentary: No rash, pruritus, skin lesions Neurological: as above Psychiatric: No depression, insomnia, anxiety Endocrine: No palpitations, fatigue, diaphoresis, mood swings, change in appetite, change in weight, increased thirst Hematologic/Lymphatic:  No anemia, purpura, petechiae. Allergic/Immunologic: no itchy/runny eyes, nasal congestion, recent allergic reactions, rashes  PHYSICAL EXAM: Vitals:   11/23/16 1505  BP: 110/76  Pulse: 83   General: No acute distress Head:  Normocephalic/atraumatic Neck: supple, no paraspinal tenderness, full range of motion Heart:  Regular rate and rhythm Lungs:  Clear to auscultation bilaterally Back: No paraspinal tenderness Skin/Extremities: No rash, no edema. She has a small nodule  on the dorsum of her right hand suggestive of ganglion cyst Neurological Exam: alert and oriented to person, place, and time. No aphasia or dysarthria. Fund of knowledge is appropriate.  Recent and remote memory are intact.  Attention and concentration are normal.    Able to name objects and repeat phrases. Cranial nerves: Pupils equal, round, reactive to light.  Extraocular movements intact with no nystagmus. Visual fields full. Facial sensation intact. No facial asymmetry. Tongue, uvula, palate midline.  Motor: Bulk and tone normal, muscle strength 5/5 throughout with no pronator drift.  Sensation: intact to pin and light touch. No extinction to double simultaneous stimulation.  Deep tendon reflexes brisk 2+ throughout, toes downgoing, negative Hoffman sign.  Finger to nose testing intact.  Gait narrow-based and steady, able to tandem walk adequately.  Romberg negative. Negative Tinel and Phalen's sign.  IMPRESSION: This is a pleasant 46 yo RH woman who I had initially seen for positional vertigo, MRI at that time had shown mild Chiari I malformation that she is asymptomatic from. She presented last  November with left thumb pain and tingling, as well as left 5th toe pain. EMG/NCV of the left UE was normal. She reports the left hand symptoms have resolved, the foot pain continues and she follows up with Podiatry. Her new complaint today is tingling down the 3rd and 4th digits of her right hand when she touches a nodule on the dorsum of her hand, which appears to be a ganglion cyst. She will be referred to Dr. Tamala Julian for evaluation and management if needed. She will follow-up with me on a prn basis and knows to call for any changes.  Thank you for allowing me to participate in her care.  Please do not hesitate to call for any questions or concerns.  The duration of this appointment visit was 25 minutes of face-to-face time with the patient.  Greater than 50% of this time was spent in counseling, explanation  of diagnosis, planning of further management, and coordination of care.   Ellouise Newer, M.D.   CC: Dr. Cheri Rous

## 2016-11-27 ENCOUNTER — Encounter: Payer: Self-pay | Admitting: Neurology

## 2017-02-13 ENCOUNTER — Encounter: Payer: Self-pay | Admitting: Family Medicine

## 2017-02-13 ENCOUNTER — Ambulatory Visit (INDEPENDENT_AMBULATORY_CARE_PROVIDER_SITE_OTHER): Payer: BLUE CROSS/BLUE SHIELD | Admitting: Family Medicine

## 2017-02-13 VITALS — BP 126/78 | HR 66 | Temp 98.0°F | Resp 16 | Ht 64.0 in | Wt 122.4 lb

## 2017-02-13 DIAGNOSIS — J301 Allergic rhinitis due to pollen: Secondary | ICD-10-CM | POA: Diagnosis not present

## 2017-02-13 MED ORDER — LEVOCETIRIZINE DIHYDROCHLORIDE 5 MG PO TABS: 5.0000 mg | ORAL_TABLET | Freq: Every evening | ORAL | 5 refills | Status: DC

## 2017-02-13 MED ORDER — MONTELUKAST SODIUM 10 MG PO TABS: 10.0000 mg | ORAL_TABLET | Freq: Every day | ORAL | 5 refills | Status: DC

## 2017-02-13 MED ORDER — FLUTICASONE PROPIONATE 50 MCG/ACT NA SUSP: 2.0000 | Freq: Every day | NASAL | 6 refills | Status: DC

## 2017-02-13 NOTE — Progress Notes (Signed)
Patient ID: Marilyn Green, female   DOB: 1971-04-08, 46 y.o.   MRN: [id number]    Subjective:    Patient ID: Marilyn Green, female    DOB: Dec 14, 1970, 46 y.o.   MRN: [id number]  Chief Complaint  Patient presents with  . Headache    thinks it may be pollen    HPI  Patient is in today for headache that started last Tuesday.  It made her feel a little dizzy and she took meclizine.  No dizziness today.  She has taken Excedrin and ibuprofen which helped only a little.  She thinks it may be the pollen.  She has also been taken Sudafed which she felt like helped.  Patient Care Team: Carollee Herter, Alferd Apa, DO as PCP - General (Family Medicine)   Past Medical History:  Diagnosis Date  . Migraine     History reviewed. No pertinent surgical history.  Family History  Problem Relation Age of Onset  . Hyperlipidemia Mother   . Hypertension Mother     Social History   Social History  . Marital status: Single    Spouse name: N/A  . Number of children: N/A  . Years of education: N/A   Occupational History  . Not on file.   Social History Main Topics  . Smoking status: Never Smoker  . Smokeless tobacco: Never Used  . Alcohol use Yes     Comment: Rarely  . Drug use: No  . Sexual activity: Not Currently   Other Topics Concern  . Not on file   Social History Narrative   Exercise- no    Outpatient Medications Prior to Visit  Medication Sig Dispense Refill  . Biotin 1000 MCG tablet Take 1,000 mcg by mouth daily.    . cholecalciferol (VITAMIN D) 1000 UNITS tablet Take 1,000 Units by mouth daily.    Marland Kitchen ECHINACEA PO Take 1 tablet by mouth daily.    Marland Kitchen levonorgestrel (MIRENA) 20 MCG/24HR IUD 1 each by Intrauterine route once.    . magnesium oxide (MAG-OX) 400 MG tablet Take 400 mg by mouth daily.    . meclizine (ANTIVERT) 25 MG tablet Take 1 tablet (25 mg total) by mouth 3 (three) times daily as needed for dizziness. 30 tablet 0  . metoCLOPramide (REGLAN) 10 MG tablet Take 10 mg  by mouth 4 (four) times daily.    . RELPAX 40 MG tablet Take 1 tablet by mouth as needed.    . Riboflavin (B-2) 100 MG TABS Take by mouth.    . montelukast (SINGULAIR) 10 MG tablet Take 10 mg by mouth at bedtime.     No facility-administered medications prior to visit.     No Known Allergies  Review of Systems  Constitutional: Negative for fever and malaise/fatigue.  HENT: Positive for congestion.        Sinus pressure  Eyes: Negative for blurred vision.  Respiratory: Negative for cough and shortness of breath.   Cardiovascular: Negative for chest pain, palpitations and leg swelling.  Gastrointestinal: Negative for vomiting.  Musculoskeletal: Negative for back pain.  Skin: Negative for rash.  Neurological: Positive for headaches. Negative for loss of consciousness.       Objective:    Physical Exam  Constitutional: She is oriented to person, place, and time. She appears well-developed and well-nourished. No distress.  HENT:  Head: Normocephalic and atraumatic.  Right Ear: Tympanic membrane is not injected, not retracted and not bulging. Tympanic membrane mobility is abnormal. A middle ear effusion is present.  Left Ear: Tympanic membrane is not injected, not retracted and not bulging. Tympanic membrane mobility is abnormal. A middle ear effusion is present.  Eyes: Conjunctivae are normal.  Neck: Normal range of motion. No thyromegaly present.  Cardiovascular: Normal rate and regular rhythm.   Pulmonary/Chest: Effort normal and breath sounds normal. She has no wheezes.  Abdominal: Soft. Bowel sounds are normal. There is no tenderness.  Musculoskeletal: Normal range of motion. She exhibits no edema or deformity.  Neurological: She is alert and oriented to person, place, and time.  Skin: Skin is warm and dry. She is not diaphoretic.  Psychiatric: She has a normal mood and affect.  Nursing note and vitals reviewed.   BP 126/78 (BP Location: Left Arm, Cuff Size: Normal)    Pulse 66   Temp 98 F (36.7 C) (Oral)   Resp 16   Ht 5\' 4"  (1.626 m)   Wt 122 lb 6.4 oz (55.5 kg)   SpO2 97%   BMI 21.01 kg/m  Wt Readings from Last 3 Encounters:  02/13/17 122 lb 6.4 oz (55.5 kg)  11/23/16 119 lb 9 oz (54.2 kg)  08/29/16 114 lb 5 oz (51.9 kg)   BP Readings from Last 3 Encounters:  02/13/17 126/78  11/23/16 110/76  08/29/16 108/74     Immunization History  Administered Date(s) Administered  . Influenza,inj,Quad PF,36+ Mos 07/11/2013, 08/20/2014, 07/22/2015, 07/18/2016  . Tdap 09/13/2009    Health Maintenance  Topic Date Due  . HIV Screening  05/29/1984  . MAMMOGRAM  07/15/2014  . PAP SMEAR  06/17/2016  . INFLUENZA VACCINE  05/02/2017  . TETANUS/TDAP  09/14/2019    Lab Results  Component Value Date   WBC 4.2 02/19/2014   HGB 12.4 02/19/2014   HCT 37.5 02/19/2014   PLT 183.0 02/19/2014   GLUCOSE 78 02/19/2014   CHOL 196 08/13/2013   TRIG 40.0 08/13/2013   HDL 61.30 08/13/2013   LDLCALC 127 (H) 08/13/2013   ALT 11 02/19/2014   AST 15 02/19/2014   NA 142 02/19/2014   K 4.5 02/19/2014   CL 108 02/19/2014   CREATININE 0.9 02/19/2014   BUN 7 02/19/2014   CO2 28 02/19/2014   TSH 1.99 08/13/2013    Lab Results  Component Value Date   TSH 1.99 08/13/2013   Lab Results  Component Value Date   WBC 4.2 02/19/2014   HGB 12.4 02/19/2014   HCT 37.5 02/19/2014   MCV 88.3 02/19/2014   PLT 183.0 02/19/2014   Lab Results  Component Value Date   NA 142 02/19/2014   K 4.5 02/19/2014   CO2 28 02/19/2014   GLUCOSE 78 02/19/2014   BUN 7 02/19/2014   CREATININE 0.9 02/19/2014   BILITOT 0.6 02/19/2014   ALKPHOS 39 02/19/2014   AST 15 02/19/2014   ALT 11 02/19/2014   PROT 6.8 02/19/2014   ALBUMIN 3.9 02/19/2014   CALCIUM 9.1 02/19/2014   GFR 90.66 02/19/2014   Lab Results  Component Value Date   CHOL 196 08/13/2013   Lab Results  Component Value Date   HDL 61.30 08/13/2013   Lab Results  Component Value Date   LDLCALC 127 (H)  08/13/2013   Lab Results  Component Value Date   TRIG 40.0 08/13/2013   Lab Results  Component Value Date   CHOLHDL 3 08/13/2013   No results found for: HGBA1C       Assessment & Plan:   Problem List Items Addressed This Visit    None  Visit Diagnoses    Seasonal allergic rhinitis due to pollen    -  Primary   Relevant Medications   levocetirizine (XYZAL) 5 MG tablet   fluticasone (FLONASE) 50 MCG/ACT nasal spray   montelukast (SINGULAIR) 10 MG tablet      I have changed Ms. Dice's montelukast. I am also having her start on levocetirizine and fluticasone. Additionally, I am having her maintain her RELPAX, ECHINACEA PO, Biotin, cholecalciferol, magnesium oxide, metoCLOPramide, B-2, levonorgestrel, and meclizine.  Meds ordered this encounter  Medications  . levocetirizine (XYZAL) 5 MG tablet    Sig: Take 1 tablet (5 mg total) by mouth every evening.    Dispense:  30 tablet    Refill:  5  . fluticasone (FLONASE) 50 MCG/ACT nasal spray    Sig: Place 2 sprays into both nostrils daily.    Dispense:  16 g    Refill:  6  . montelukast (SINGULAIR) 10 MG tablet    Sig: Take 1 tablet (10 mg total) by mouth at bedtime.    Dispense:  30 tablet    Refill:  5    Ann Held, DO

## 2017-02-13 NOTE — Patient Instructions (Signed)
Allergies An allergy is when your body reacts to a substance in a way that is not normal. An allergic reaction can happen after you:  Eat something.  Breathe in something.  Touch something. You can be allergic to:  Things that are only around during certain seasons, like molds and pollens.  Foods.  Drugs.  Insects.  Animal dander. What are the signs or symptoms?  Puffiness (swelling). This may happen on the lips, face, tongue, mouth, or throat.  Sneezing.  Coughing.  Breathing loudly (wheezing).  Stuffy nose.  Tingling in the mouth.  A rash.  Itching.  Itchy, red, puffy areas of skin (hives).  Watery eyes.  Throwing up (vomiting).  Watery poop (diarrhea).  Dizziness.  Feeling faint or fainting.  Trouble breathing or swallowing.  A tight feeling in the chest.  A fast heartbeat. How is this diagnosed? Allergies can be diagnosed with:  A medical and family history.  Skin tests.  Blood tests.  A food diary. A food diary is a record of all the foods, drinks, and symptoms you have each day.  The results of an elimination diet. This diet involves making sure not to eat certain foods and then seeing what happens when you start eating them again. How is this treated? There is no cure for allergies, but allergic reactions can be treated with medicine. Severe reactions usually need to be treated at a hospital. How is this prevented? The best way to prevent an allergic reaction is to avoid the thing you are allergic to. Allergy shots and medicines can also help prevent reactions in some cases. This information is not intended to replace advice given to you by your health care provider. Make sure you discuss any questions you have with your health care provider. Document Released: 01/13/2013 Document Revised: 05/15/2016 Document Reviewed: 06/30/2014 Elsevier Interactive Patient Education  2017 [employer].

## 2017-02-16 ENCOUNTER — Encounter: Payer: Self-pay | Admitting: Family Medicine

## 2017-04-03 ENCOUNTER — Telehealth: Payer: Self-pay | Admitting: *Deleted

## 2017-04-03 MED ORDER — CETIRIZINE HCL 10 MG PO TABS: 10.0000 mg | ORAL_TABLET | Freq: Every day | ORAL | 6 refills | Status: DC

## 2017-04-03 NOTE — Telephone Encounter (Signed)
[facility] Marilyn Green, sent a fax stating that insurance will not pay for Xyzal, but will cover Zyrtec.  Can we change this?

## 2017-04-03 NOTE — Telephone Encounter (Signed)
Patient ok with trying the zyrtec.  rx sent in.

## 2017-04-03 NOTE — Telephone Encounter (Signed)
Only if the pt is ok w/ the change, ask pt  If she agrees , send a Rx for zyrtec 10 mg 1 po qd  #30, 6 RF

## 2017-05-20 ENCOUNTER — Encounter: Payer: Self-pay | Admitting: Family Medicine

## 2017-05-21 ENCOUNTER — Ambulatory Visit (INDEPENDENT_AMBULATORY_CARE_PROVIDER_SITE_OTHER): Payer: BLUE CROSS/BLUE SHIELD | Admitting: Medical

## 2017-05-21 ENCOUNTER — Encounter: Payer: Self-pay | Admitting: Medical

## 2017-05-21 VITALS — BP 138/84 | HR 68 | Temp 98.3°F | Resp 16 | Ht 64.0 in | Wt 121.8 lb

## 2017-05-21 DIAGNOSIS — T7840XA Allergy, unspecified, initial encounter: Secondary | ICD-10-CM | POA: Diagnosis not present

## 2017-05-21 MED ORDER — METHYLPREDNISOLONE ACETATE 40 MG/ML IJ SUSP
40.0000 mg | Freq: Once | INTRAMUSCULAR | Status: AC
  Administered 2017-05-21: 40 mg via INTRAMUSCULAR

## 2017-05-21 MED ORDER — PREDNISONE 10 MG PO TABS: ORAL_TABLET | ORAL | 0 refills | Status: DC

## 2017-05-21 MED ORDER — HYDROXYZINE HCL 25 MG PO TABS: 25.0000 mg | ORAL_TABLET | Freq: Three times a day (TID) | ORAL | 0 refills | Status: DC | PRN

## 2017-05-21 NOTE — Addendum Note (Signed)
Addended by: Hinton Dyer on: 05/21/2017 02:05 PM   Modules accepted: Orders

## 2017-05-21 NOTE — Progress Notes (Signed)
Subjective:    Patient ID: Marilyn Green, female    DOB: 02/03/1971, 46 y.o.   MRN: [id number]  HPI  Pt in states just yesterday she broke out with a rash. Rash on arms an back. Rash first on her elbow regions. The spread to forearms and her back. Pt worked in yard past weekend. But not recently. No insect bites that she knows of  No new soaps, detergents, animals, lotions, creams or new meds. No pets in house.(no suspicious review on exposure)  No history of eczema when younger.  No wheezing,no sob or breath. No lip swelling.  LMP-mirena.  Calamine lotion is not helping much.    Review of Systems  Constitutional: Negative for chills, fatigue and fever.  HENT: Negative for congestion, sinus pressure, sore throat and tinnitus.   Respiratory: Negative for cough, chest tightness, shortness of breath and wheezing.   Cardiovascular: Negative for chest pain and palpitations.  Genitourinary: Negative for dysuria.  Musculoskeletal: Negative for arthralgias, back pain, joint swelling and neck stiffness.  Skin: Positive for rash.       Severe itching.   Neurological: Negative for dizziness, syncope, speech difficulty, weakness, light-headedness, numbness and headaches.  Hematological: Negative for adenopathy. Does not bruise/bleed easily.  Psychiatric/Behavioral: Negative for behavioral problems, confusion, dysphoric mood, self-injury and suicidal ideas. The patient is not nervous/anxious and is not hyperactive.     Past Medical History:  Diagnosis Date  . Migraine      Social History   Social History  . Marital status: Single    Spouse name: N/A  . Number of children: N/A  . Years of education: N/A   Occupational History  . Not on file.   Social History Main Topics  . Smoking status: Never Smoker  . Smokeless tobacco: Never Used  . Alcohol use Yes     Comment: Rarely  . Drug use: No  . Sexual activity: Not Currently   Other Topics Concern  . Not on file   Social  History Narrative   Exercise- no    No past surgical history on file.  Family History  Problem Relation Age of Onset  . Hyperlipidemia Mother   . Hypertension Mother     No Known Allergies  Current Outpatient Prescriptions on File Prior to Visit  Medication Sig Dispense Refill  . Biotin 1000 MCG tablet Take 1,000 mcg by mouth daily.    . cetirizine (ZYRTEC) 10 MG tablet Take 1 tablet (10 mg total) by mouth daily. 30 tablet 6  . cholecalciferol (VITAMIN D) 1000 UNITS tablet Take 1,000 Units by mouth daily.    Marland Kitchen ECHINACEA PO Take 1 tablet by mouth daily.    . fluticasone (FLONASE) 50 MCG/ACT nasal spray Place 2 sprays into both nostrils daily. 16 g 6  . levonorgestrel (MIRENA) 20 MCG/24HR IUD 1 each by Intrauterine route once.    . magnesium oxide (MAG-OX) 400 MG tablet Take 400 mg by mouth daily.    . meclizine (ANTIVERT) 25 MG tablet Take 1 tablet (25 mg total) by mouth 3 (three) times daily as needed for dizziness. 30 tablet 0  . metoCLOPramide (REGLAN) 10 MG tablet Take 10 mg by mouth 4 (four) times daily.    . montelukast (SINGULAIR) 10 MG tablet Take 1 tablet (10 mg total) by mouth at bedtime. 30 tablet 5  . RELPAX 40 MG tablet Take 1 tablet by mouth as needed.    . Riboflavin (B-2) 100 MG TABS Take by mouth.  No current facility-administered medications on file prior to visit.     BP 138/84   Pulse 68   Temp 98.3 F (36.8 C) (Oral)   Resp 16   Ht 5\' 4"  (1.626 m)   Wt 121 lb 12.8 oz (55.2 kg)   SpO2 100%   BMI 20.91 kg/m       Objective:   Physical Exam  General Mental Status- Alert. General Appearance- Not in acute distress.   Skin .diffuse scattered severe papular rash on elbows, arms back and some beginning rt side of her face/nose.  Neck Carotid Arteries- Normal color. Moisture- Normal Moisture. No carotid bruits. No JVD.  Chest and Lung Exam Auscultation: Breath Sounds:-Normal.  Cardiovascular Auscultation:Rythm- Regular. Murmurs & Other  Heart Sounds:Auscultation of the heart reveals- No Murmurs.   Neurologic Cranial Nerve exam:- CN III-XII intact(No nystagmus), symmetric smile. Strength:- 5/5 equal and symmetric strength both upper and lower extremities.        Assessment & Plan:  The exact etiology of your  allergic reaction is  unknown . We gave you depo-medrol 40  im injection. I am also prescribing oral prednisone and hydroxyzine for itching. Your rash should gradually improve. If worsening or expanding  please notify us. If your rash reoccurs intermittently and no cause is identified then could consider allergist referral.  Work note given to use if needed  Follow up in 7 days or as needed.

## 2017-05-21 NOTE — Patient Instructions (Signed)
The exact etiology of your  allergic reaction is  unknown . We gave you depo-medrol 40  im injection. I am also prescribing oral prednisone and hydroxyzine for itching. Your rash should gradually improve. If worsening or expanding  please notify us. If your rash reoccurs intermittently and no cause is identified then could consider allergist referral.  Work note given to use if needed  Follow up in 7 days or as needed.

## 2017-05-21 NOTE — Telephone Encounter (Signed)
Pt has appt 05/21/2017 w/ Percell Miller at 1 PM.

## 2017-05-21 NOTE — Telephone Encounter (Signed)
Pt has appt scheduled w/ Percell Miller today at 1300.

## 2017-07-24 ENCOUNTER — Ambulatory Visit (INDEPENDENT_AMBULATORY_CARE_PROVIDER_SITE_OTHER): Payer: BLUE CROSS/BLUE SHIELD

## 2017-07-24 DIAGNOSIS — Z23 Encounter for immunization: Secondary | ICD-10-CM | POA: Diagnosis not present

## 2017-08-02 DIAGNOSIS — Z01419 Encounter for gynecological examination (general) (routine) without abnormal findings: Secondary | ICD-10-CM | POA: Diagnosis not present

## 2017-08-02 DIAGNOSIS — Z130 Encounter for screening for diseases of the blood and blood-forming organs and certain disorders involving the immune mechanism: Secondary | ICD-10-CM | POA: Diagnosis not present

## 2017-08-02 DIAGNOSIS — Z1389 Encounter for screening for other disorder: Secondary | ICD-10-CM | POA: Diagnosis not present

## 2017-08-02 DIAGNOSIS — Z6821 Body mass index (BMI) 21.0-21.9, adult: Secondary | ICD-10-CM | POA: Diagnosis not present

## 2017-08-02 DIAGNOSIS — Z1231 Encounter for screening mammogram for malignant neoplasm of breast: Secondary | ICD-10-CM | POA: Diagnosis not present

## 2017-08-02 DIAGNOSIS — Z975 Presence of (intrauterine) contraceptive device: Secondary | ICD-10-CM | POA: Diagnosis not present

## 2017-08-08 DIAGNOSIS — Z30432 Encounter for removal of intrauterine contraceptive device: Secondary | ICD-10-CM | POA: Diagnosis not present

## 2017-08-08 DIAGNOSIS — Z30430 Encounter for insertion of intrauterine contraceptive device: Secondary | ICD-10-CM | POA: Diagnosis not present

## 2017-09-19 DIAGNOSIS — Z975 Presence of (intrauterine) contraceptive device: Secondary | ICD-10-CM | POA: Diagnosis not present

## 2018-04-16 ENCOUNTER — Ambulatory Visit: Payer: Self-pay

## 2018-04-16 NOTE — Telephone Encounter (Signed)
Patient called in with c/o "ear swelling to face." She says "I have noticed some swelling from my ears down the jaw line of my face on both sides. At first, one of my ears felt like it needed to pop, then it progressed to both ears. I looked in the mirror and noticed the swelling, mild, tender to the touch, at a 5. This morning it was a 9 pain. I was sucking on some candy and it was uncomfortable in my mouth, I had to take the candy out. I swallowed some coffee this morning and had no problems. I haven't eaten anything different. Now both of my ears are clogged, but I can hear out of them." I asked about other symptoms, she denies. According to protocol, see PCP within 24 hours, no availability with PCP, appointment scheduled for tomorrow at 1020 with Mackie Pai, [facility], care advice given, patient verbalized understanding.   Reason for Disposition . Swelling is painful to touch  Answer Assessment - Initial Assessment Questions 1. ONSET: "When did the swelling start?" (e.g., minutes, hours, days)     Today 2. LOCATION: "What part of the face is swollen?"     From both ears down my jaw line 3. SEVERITY: "How swollen is it?"     Mild 4. ITCHING: "Is there any itching?" If so, ask: "How much?"   (Scale 1-10; mild, moderate or severe)     No 5. PAIN: "Is the swelling painful to touch?" If so, ask: "How painful is it?"   (Scale 1-10; mild, moderate or severe)     5; painful to touch 6. FEVER: "Do you have a fever?" If so, ask: "What is it, how was it measured, and when did it start?"      No 7. CAUSE: "What do you think is causing the face swelling?"     I don't know 8. RECURRENT SYMPTOM: "Have you had face swelling before?" If so, ask: "When was the last time?" "What happened that time?"     No 9. OTHER SYMPTOMS: "Do you have any other symptoms?" (e.g., toothache, leg swelling)     Ears feel clogged 10. PREGNANCY: "Is there any chance you are pregnant?" "When was your last menstrual period?"     No  Protocols used: [facility]

## 2018-04-17 ENCOUNTER — Ambulatory Visit: Payer: BLUE CROSS/BLUE SHIELD | Admitting: Medical

## 2018-04-19 ENCOUNTER — Ambulatory Visit: Payer: BLUE CROSS/BLUE SHIELD | Admitting: Medical

## 2018-07-19 ENCOUNTER — Ambulatory Visit (INDEPENDENT_AMBULATORY_CARE_PROVIDER_SITE_OTHER): Payer: BLUE CROSS/BLUE SHIELD

## 2018-07-19 DIAGNOSIS — Z23 Encounter for immunization: Secondary | ICD-10-CM

## 2018-07-19 NOTE — Progress Notes (Signed)
Patient came in for her Flu vaccine. She tolerated the injection in her right arm with no complications

## 2018-08-06 IMAGING — DX DG LUMBAR SPINE 2-3V
3 series · 3 of 3 positions shown · non-contrast
Comparison: None.

CLINICAL DATA: Low back pain for 4 months with no trauma.

EXAM:
LUMBAR SPINE - 2-3 VIEW

[l-spine ap]
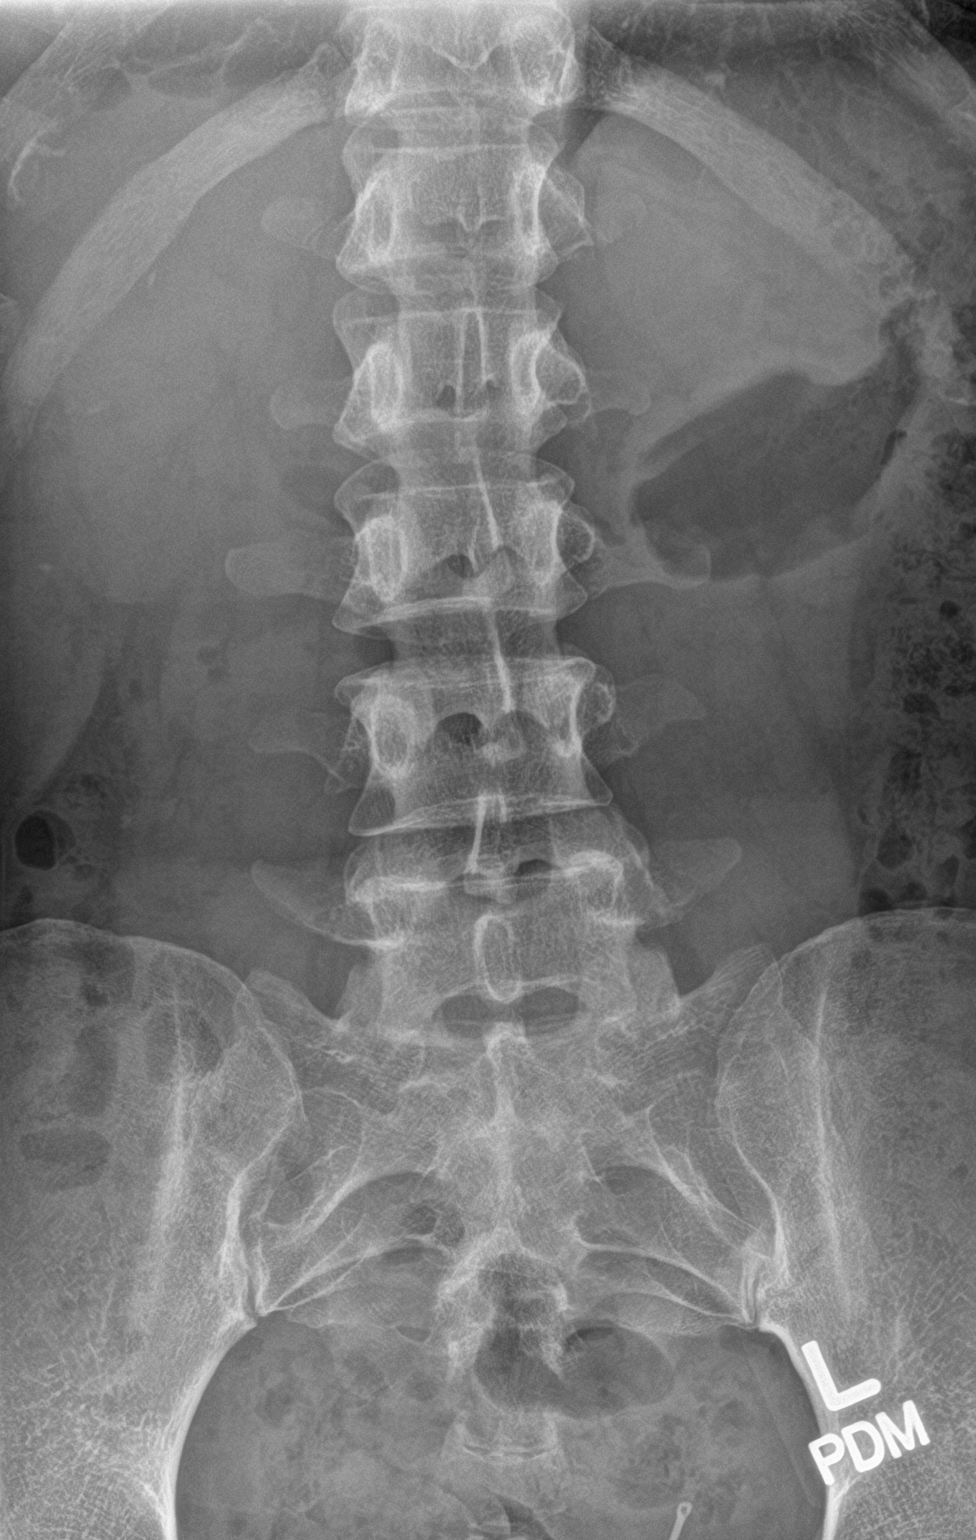

[l-spine lat]
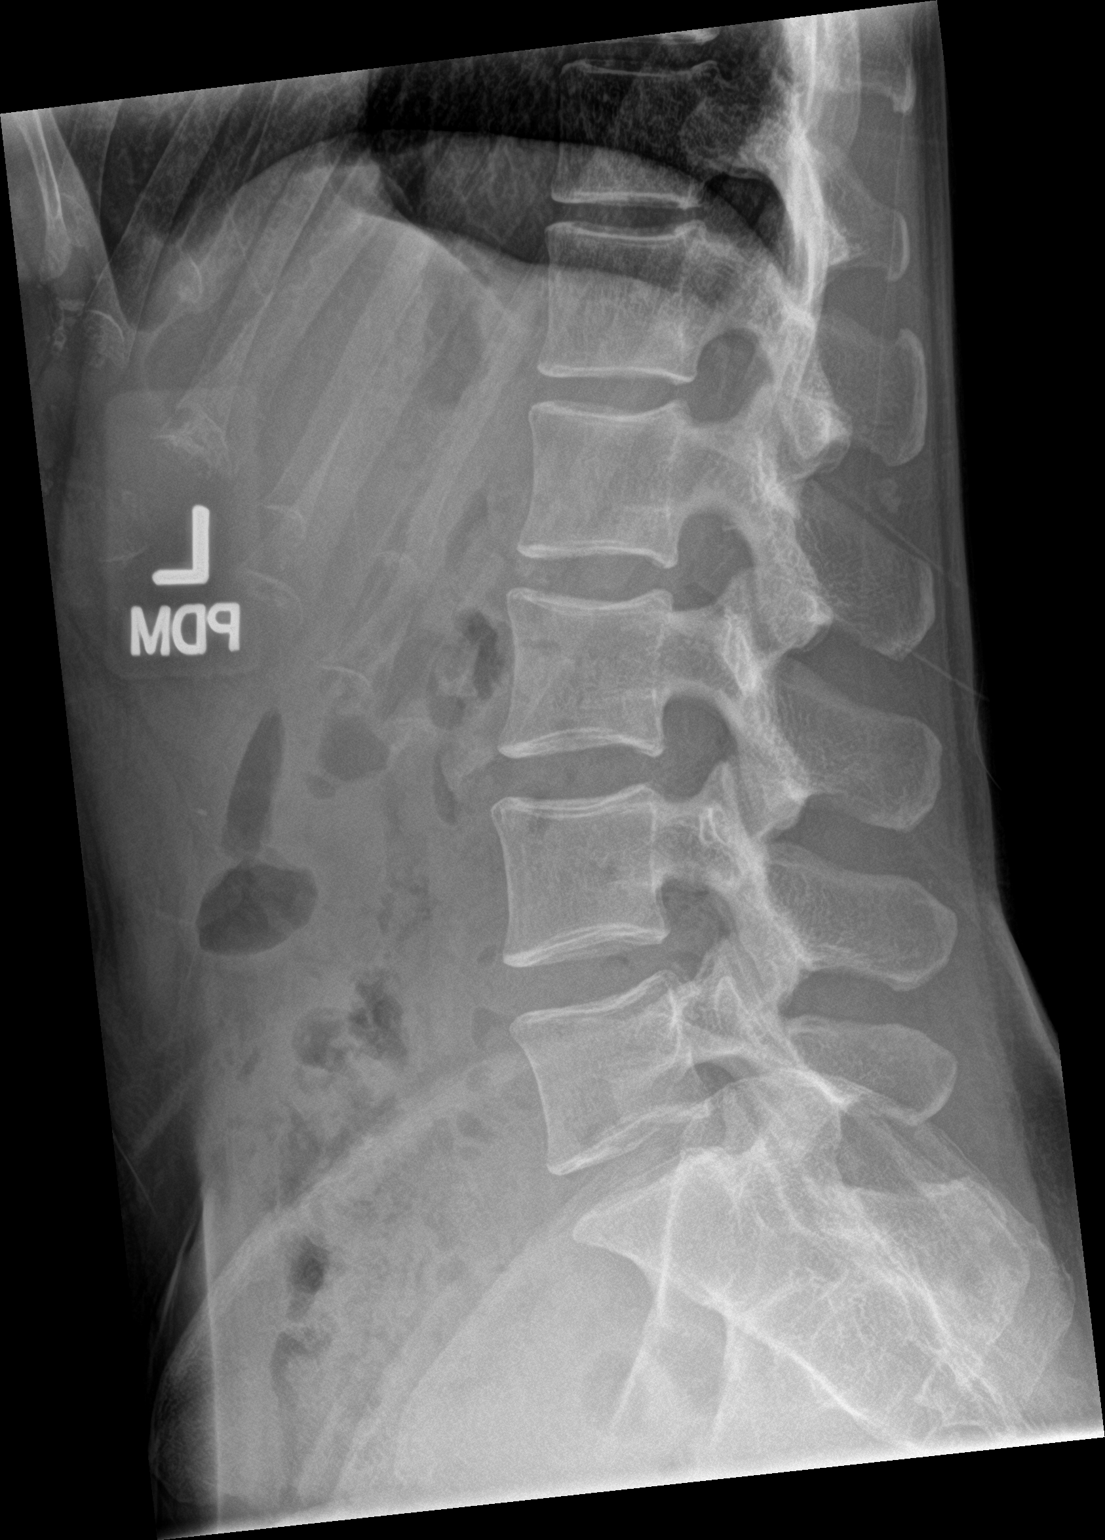

[l-spine spot]
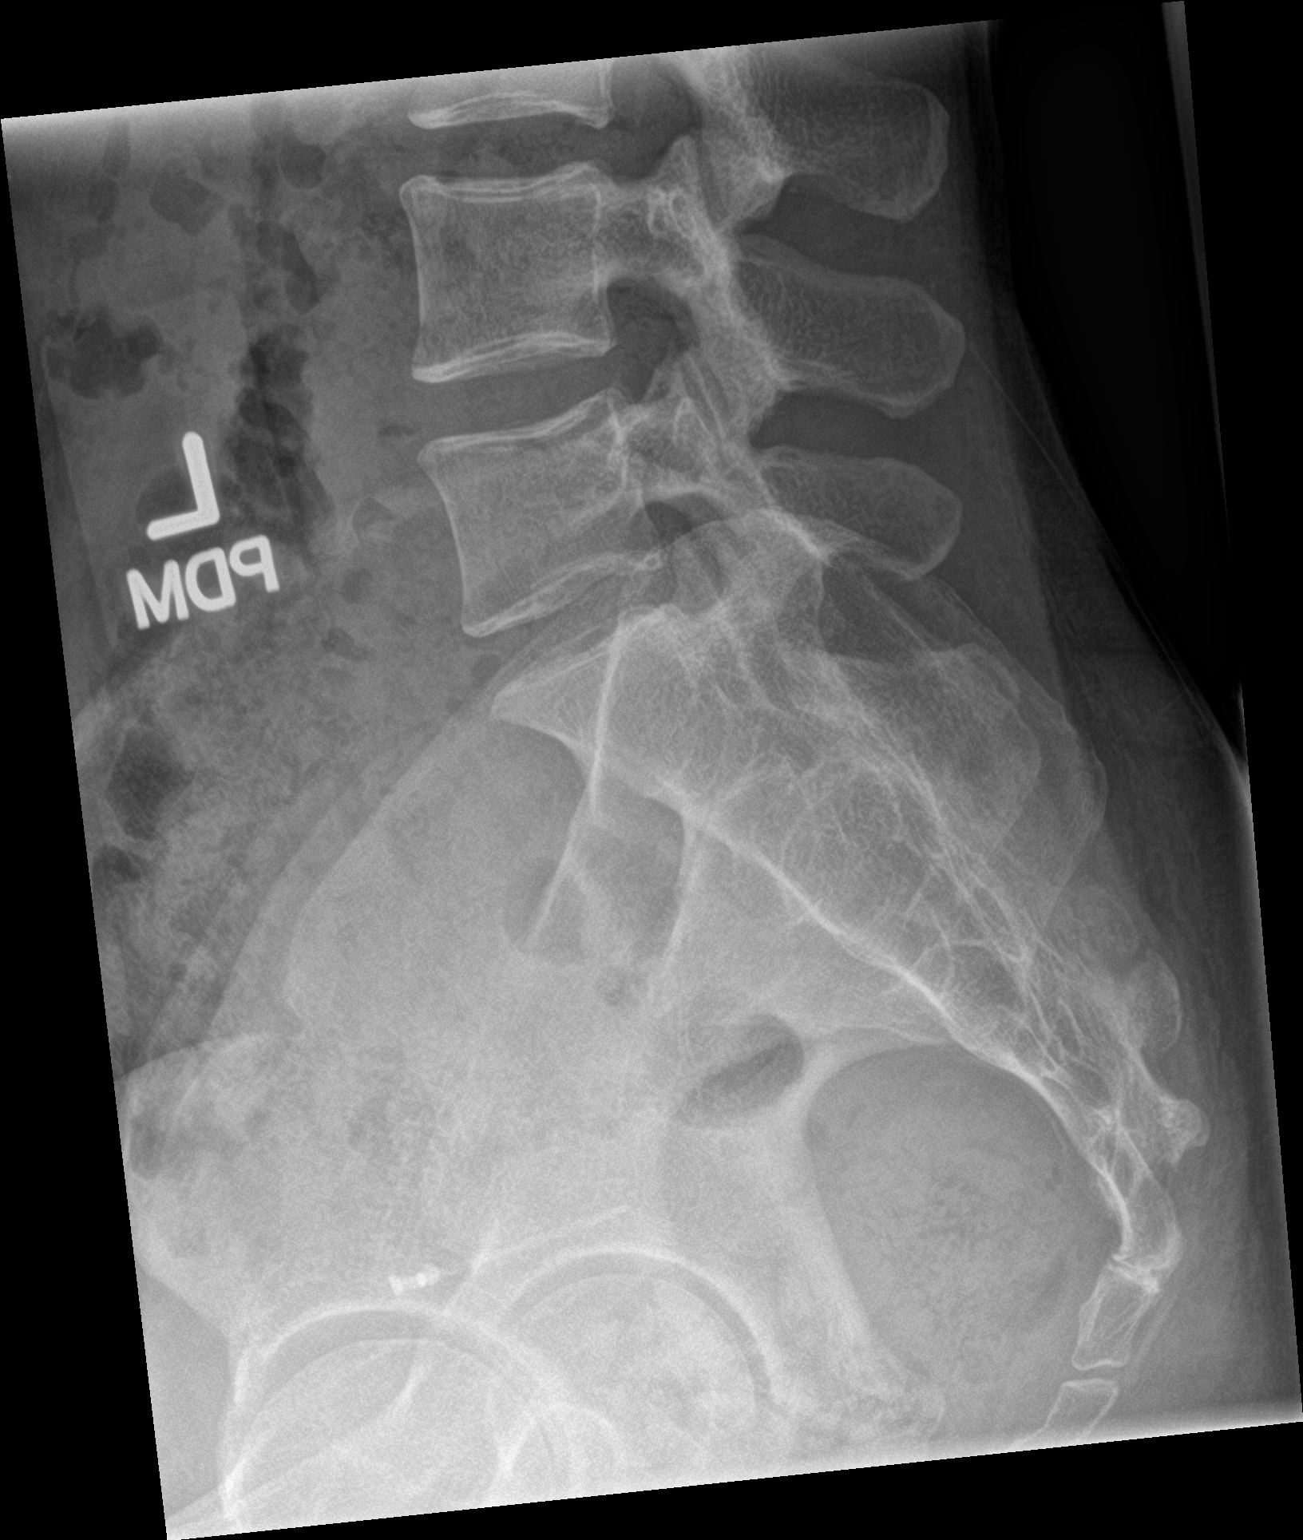

[3 of 3 positions shown; findings below may reference images not displayed]

FINDINGS: There is no evidence of lumbar spine fracture. Alignment is normal.
Intervertebral disc spaces are maintained.
IMPRESSION: Negative.

## 2018-09-03 DIAGNOSIS — Z130 Encounter for screening for diseases of the blood and blood-forming organs and certain disorders involving the immune mechanism: Secondary | ICD-10-CM | POA: Diagnosis not present

## 2018-09-03 DIAGNOSIS — R61 Generalized hyperhidrosis: Secondary | ICD-10-CM | POA: Diagnosis not present

## 2018-09-03 DIAGNOSIS — Z6823 Body mass index (BMI) 23.0-23.9, adult: Secondary | ICD-10-CM | POA: Diagnosis not present

## 2018-09-03 DIAGNOSIS — Z975 Presence of (intrauterine) contraceptive device: Secondary | ICD-10-CM | POA: Diagnosis not present

## 2018-09-03 DIAGNOSIS — Z01419 Encounter for gynecological examination (general) (routine) without abnormal findings: Secondary | ICD-10-CM | POA: Diagnosis not present

## 2018-09-03 DIAGNOSIS — Z1231 Encounter for screening mammogram for malignant neoplasm of breast: Secondary | ICD-10-CM | POA: Diagnosis not present

## 2018-10-02 HISTORY — PX: COLONOSCOPY: SHX174

## 2018-12-11 ENCOUNTER — Emergency Department (HOSPITAL_BASED_OUTPATIENT_CLINIC_OR_DEPARTMENT_OTHER): Payer: BLUE CROSS/BLUE SHIELD

## 2018-12-11 ENCOUNTER — Emergency Department (HOSPITAL_BASED_OUTPATIENT_CLINIC_OR_DEPARTMENT_OTHER)
Admission: EM | Admit: 2018-12-11 | Discharge: 2018-12-11 | Disposition: A | Discharge status: Home / Self Care | Payer: BLUE CROSS/BLUE SHIELD | Attending: Emergency Medicine | Admitting: Emergency Medicine

## 2018-12-11 ENCOUNTER — Other Ambulatory Visit: Payer: Self-pay

## 2018-12-11 ENCOUNTER — Encounter (HOSPITAL_BASED_OUTPATIENT_CLINIC_OR_DEPARTMENT_OTHER): Payer: Self-pay | Admitting: *Deleted

## 2018-12-11 ENCOUNTER — Ambulatory Visit: Payer: Self-pay | Admitting: *Deleted

## 2018-12-11 DIAGNOSIS — R42 Dizziness and giddiness: Secondary | ICD-10-CM | POA: Diagnosis not present

## 2018-12-11 DIAGNOSIS — R51 Headache: Secondary | ICD-10-CM | POA: Diagnosis not present

## 2018-12-11 DIAGNOSIS — R197 Diarrhea, unspecified: Secondary | ICD-10-CM | POA: Diagnosis not present

## 2018-12-11 DIAGNOSIS — Z79899 Other long term (current) drug therapy: Secondary | ICD-10-CM | POA: Diagnosis not present

## 2018-12-11 DIAGNOSIS — K529 Noninfective gastroenteritis and colitis, unspecified: Secondary | ICD-10-CM | POA: Diagnosis not present

## 2018-12-11 DIAGNOSIS — R109 Unspecified abdominal pain: Secondary | ICD-10-CM | POA: Diagnosis not present

## 2018-12-11 LAB — CBC WITH DIFFERENTIAL/PLATELET
Abs Immature Granulocytes: 0.01 10*3/uL (ref 0.00–0.07)
Basophils Absolute: 0 10*3/uL (ref 0.0–0.1)
Basophils Relative: 1 %
EOS ABS: 0 10*3/uL (ref 0.0–0.5)
Eosinophils Relative: 0 %
HEMATOCRIT: 44.3 % (ref 36.0–46.0)
Hemoglobin: 13.7 g/dL (ref 12.0–15.0)
Immature Granulocytes: 0 %
LYMPHS ABS: 1 10*3/uL (ref 0.7–4.0)
Lymphocytes Relative: 18 %
MCH: 27.5 pg (ref 26.0–34.0)
MCHC: 30.9 g/dL (ref 30.0–36.0)
MCV: 89 fL (ref 80.0–100.0)
Monocytes Absolute: 0.3 10*3/uL (ref 0.1–1.0)
Monocytes Relative: 6 %
Neutro Abs: 4 10*3/uL (ref 1.7–7.7)
Neutrophils Relative %: 75 %
Platelets: 217 10*3/uL (ref 150–400)
RBC: 4.98 MIL/uL (ref 3.87–5.11)
RDW: 12.6 % (ref 11.5–15.5)
WBC: 5.4 10*3/uL (ref 4.0–10.5)
nRBC: 0 % (ref 0.0–0.2)

## 2018-12-11 LAB — COMPREHENSIVE METABOLIC PANEL
ALT: 15 U/L (ref 0–44)
AST: 21 U/L (ref 15–41)
Albumin: 4.4 g/dL (ref 3.5–5.0)
Alkaline Phosphatase: 68 U/L (ref 38–126)
Anion gap: 9 (ref 5–15)
BUN: 15 mg/dL (ref 6–20)
CO2: 26 mmol/L (ref 22–32)
Calcium: 9.3 mg/dL (ref 8.9–10.3)
Chloride: 101 mmol/L (ref 98–111)
Creatinine, Ser: 0.8 mg/dL (ref 0.44–1.00)
Glucose, Bld: 105 mg/dL — ABNORMAL HIGH (ref 70–99)
Potassium: 3.7 mmol/L (ref 3.5–5.1)
Sodium: 136 mmol/L (ref 135–145)
Total Bilirubin: 0.9 mg/dL (ref 0.3–1.2)
Total Protein: 7.7 g/dL (ref 6.5–8.1)

## 2018-12-11 LAB — URINALYSIS, ROUTINE W REFLEX MICROSCOPIC
Bilirubin Urine: NEGATIVE
Glucose, UA: NEGATIVE mg/dL
Ketones, ur: NEGATIVE mg/dL
Nitrite: NEGATIVE
Protein, ur: NEGATIVE mg/dL
Specific Gravity, Urine: 1.01 (ref 1.005–1.030)
pH: 7 (ref 5.0–8.0)

## 2018-12-11 LAB — URINALYSIS, MICROSCOPIC (REFLEX)

## 2018-12-11 LAB — LIPASE, BLOOD: Lipase: 22 U/L (ref 11–51)

## 2018-12-11 LAB — PREGNANCY, URINE: Preg Test, Ur: NEGATIVE

## 2018-12-11 MED ORDER — ONDANSETRON HCL 4 MG/2ML IJ SOLN
4.0000 mg | Freq: Once | INTRAMUSCULAR | Status: AC
  Administered 2018-12-11: 4 mg via INTRAVENOUS
  Filled 2018-12-11: qty 2

## 2018-12-11 MED ORDER — IOPAMIDOL (ISOVUE-300) INJECTION 61%: 100.0000 mL | Freq: Once | INTRAVENOUS | Status: DC | PRN

## 2018-12-11 MED ORDER — LOPERAMIDE HCL 2 MG PO TABS: 2.0000 mg | ORAL_TABLET | Freq: Four times a day (QID) | ORAL | 0 refills | Status: DC | PRN

## 2018-12-11 MED ORDER — SODIUM CHLORIDE 0.9 % IV BOLUS
1000.0000 mL | Freq: Once | INTRAVENOUS | Status: AC
  Administered 2018-12-11: 1000 mL via INTRAVENOUS

## 2018-12-11 MED ORDER — IOHEXOL 300 MG/ML  SOLN
100.0000 mL | Freq: Once | INTRAMUSCULAR | Status: AC | PRN
  Administered 2018-12-11: 100 mL via INTRAVENOUS

## 2018-12-11 MED ORDER — SODIUM CHLORIDE 0.9 % IV SOLN
INTRAVENOUS | Status: DC
  Administered 2018-12-11: 14:00:00 via INTRAVENOUS

## 2018-12-11 NOTE — ED Provider Notes (Signed)
[facility] EMERGENCY DEPARTMENT Provider Note   CSN: [id number] Arrival date & time: 12/11/18  1021    History   Chief Complaint Chief Complaint  Patient presents with   Lightheaded    HPI Marilyn Green is a 48 y.o. female.     Patient brought in by coworker patient was at work.  She got lightheaded feeling faint feeling felt like maybe she would pass out this occurred at about 915.  Following that she developed some crampy abdominal pain.  Has had the urge to have bowel movements had 1 or 2 loose ones but no blood.  Patient felt fine yesterday.  Patient is never had any symptoms like this before no chest pain or shortness of breath no headache.  No fevers no upper respiratory symptoms.     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 No pertinent surgical history.   OB History   No obstetric history on file.      Home Medications    Prior to Admission medications   Medication Sig Start Date End Date Taking? Authorizing Provider  Biotin 1000 MCG tablet Take 1,000 mcg by mouth daily.   Yes [provider]  cholecalciferol (VITAMIN D) 1000 UNITS tablet Take 1,000 Units by mouth daily.   Yes [provider]  meclizine (ANTIVERT) 25 MG tablet Take 1 tablet (25 mg total) by mouth 3 (three) times daily as needed for dizziness. 03/03/14  Yes Roma Schanz R, DO  cetirizine (ZYRTEC) 10 MG tablet Take 1 tablet (10 mg total) by mouth daily. 04/03/17   Colon Branch, MD  ECHINACEA PO Take 1 tablet by mouth daily.    [provider]  fluticasone (FLONASE) 50 MCG/ACT nasal spray Place 2 sprays into both nostrils daily. 02/13/17   Ann Held, DO  hydrOXYzine (ATARAX/VISTARIL) 25 MG tablet  Take 1 tablet (25 mg total) by mouth every 8 (eight) hours as needed for itching. 05/21/17   Saguier, Percell Miller, PA-C  levonorgestrel (MIRENA) 20 MCG/24HR IUD 1 each by Intrauterine route once.    [provider]  loperamide (IMODIUM A-D) 2 MG tablet Take 1 tablet (2 mg total) by mouth 4 (four) times daily as needed for diarrhea or loose stools. 12/11/18   Fredia Sorrow, MD  magnesium oxide (MAG-OX) 400 MG tablet Take 400 mg by mouth daily.    [provider]  metoCLOPramide (REGLAN) 10 MG tablet Take 10 mg by mouth 4 (four) times daily.    [provider]  montelukast (SINGULAIR) 10 MG tablet Take 1 tablet (10 mg total) by mouth at bedtime. 02/13/17   Ann Held, DO  predniSONE (DELTASONE) 10 MG tablet 6 TAB PO DAY 1 5 TAB PO DAY 2 4 TAB PO DAY 3 3 TAB PO DAY 4 2 TAB PO DAY 5 1 TAB PO DAY 6 05/21/17   Saguier, Percell Miller, PA-C  RELPAX 40 MG tablet Take 1 tablet by mouth as needed. 06/30/13   [provider]  Riboflavin (B-2) 100 MG TABS Take by mouth.    [provider]    Family History Family History  Problem Relation Age of Onset   Hyperlipidemia Mother    Hypertension  Mother     Social History Social History   Tobacco Use   Smoking status: Never Smoker   Smokeless tobacco: Never Used  Substance Use Topics   Alcohol use: Not Currently   Drug use: No     Allergies   Patient has no known allergies.   Review of Systems Review of Systems  Constitutional: Negative for chills and fever.  HENT: Negative for congestion, rhinorrhea and sore throat.   Eyes: Negative for redness and visual disturbance.  Respiratory: Negative for cough and shortness of breath.   Cardiovascular: Negative for chest pain and leg swelling.  Gastrointestinal: Positive for abdominal pain and diarrhea. Negative for nausea and vomiting.  Genitourinary: Negative for dysuria.  Musculoskeletal: Negative for back pain and neck pain.  Skin: Negative for  rash.  Neurological: Negative for dizziness, syncope, light-headedness and headaches.  Hematological: Does not bruise/bleed easily.  Psychiatric/Behavioral: Negative for confusion.     Physical Exam Updated Vital Signs BP 121/82 (BP Location: Left Arm)    Pulse 91    Temp 97.9 F (36.6 C) (Oral)    Resp 18    Ht 1.638 m (5' 4.5")    Wt 60.8 kg    SpO2 100%    BMI 22.65 kg/m   Physical Exam Vitals signs and nursing note reviewed.  Constitutional:      General: She is not in acute distress.    Appearance: She is well-developed.  HENT:     Head: Normocephalic and atraumatic.     Mouth/Throat:     Mouth: Mucous membranes are moist.  Eyes:     Extraocular Movements: Extraocular movements intact.     Conjunctiva/sclera: Conjunctivae normal.     Pupils: Pupils are equal, round, and reactive to light.  Neck:     Musculoskeletal: Normal range of motion and neck supple.  Cardiovascular:     Rate and Rhythm: Normal rate and regular rhythm.     Pulses: Normal pulses.     Heart sounds: No murmur.  Pulmonary:     Effort: Pulmonary effort is normal. No respiratory distress.     Breath sounds: Normal breath sounds.  Abdominal:     General: Abdomen is flat. Bowel sounds are normal.     Palpations: Abdomen is soft.     Tenderness: There is no abdominal tenderness.  Musculoskeletal: Normal range of motion.        General: No swelling.  Skin:    General: Skin is warm and dry.     Capillary Refill: Capillary refill takes less than 2 seconds.  Neurological:     General: No focal deficit present.     Mental Status: She is alert and oriented to person, place, and time.      ED Treatments / Results  Labs (all labs ordered are listed, but only abnormal results are displayed) Labs Reviewed  COMPREHENSIVE METABOLIC PANEL - Abnormal; Notable for the following components:      Result Value   Glucose, Bld 105 (*)    All other components within normal limits  URINALYSIS, ROUTINE W REFLEX  MICROSCOPIC - Abnormal; Notable for the following components:   Hgb urine dipstick MODERATE (*)    Leukocytes,Ua SMALL (*)    All other components within normal limits  URINALYSIS, MICROSCOPIC (REFLEX) - Abnormal; Notable for the following components:   Bacteria, UA RARE (*)    All other components within normal limits  URINE CULTURE  LIPASE, BLOOD  CBC WITH DIFFERENTIAL/PLATELET  PREGNANCY, URINE  EKG EKG Interpretation  Date/Time:  Wednesday December 11 2018 10:39:27 EDT Ventricular Rate:  85 PR Interval:    QRS Duration: 76 QT Interval:  371 QTC Calculation: 442 R Axis:   83 Text Interpretation:  Sinus rhythm Baseline wander in lead(s) V1 No previous ECGs available Confirmed by Fredia Sorrow [phone]) on 12/11/2018 11:14:01 AM   Radiology Ct Abdomen Pelvis W Contrast  Result Date: 12/11/2018 CLINICAL DATA:  Acute onset of generalized abdominal cramping, diarrhea and dizziness. EXAM: CT ABDOMEN AND PELVIS WITH CONTRAST TECHNIQUE: Multidetector CT imaging of the abdomen and pelvis was performed using the standard protocol following bolus administration of intravenous contrast. CONTRAST:  164mL OMNIPAQUE IOHEXOL 300 MG/ML  SOLN COMPARISON:  Lumbar spine radiographs performed 08/28/2016 FINDINGS: Lower chest: The visualized lung bases are grossly clear. The visualized portions of the mediastinum are unremarkable. Hepatobiliary: The liver is unremarkable in appearance. The gallbladder is unremarkable in appearance. The common bile duct remains normal in caliber. Pancreas: The pancreas is within normal limits. Spleen: The spleen is unremarkable in appearance. Adrenals/Urinary Tract: The adrenal glands are unremarkable in appearance. The kidneys are within normal limits. There is no evidence of hydronephrosis. No renal or ureteral stones are identified. No perinephric stranding is seen. Stomach/Bowel: The stomach is unremarkable in appearance. The small bowel is within normal limits. The  appendix is normal in caliber, without evidence of appendicitis. Mild colonic wall thickening is noted at the sigmoid colon, with mildly increased enhancement, concerning for a mild infectious or inflammatory process. There is no evidence of perforation or abscess formation at this time. Vascular/Lymphatic: The abdominal aorta is unremarkable in appearance. The inferior vena cava is grossly unremarkable. No retroperitoneal lymphadenopathy is seen. No pelvic sidewall lymphadenopathy is identified. Reproductive: The bladder is decompressed and grossly unremarkable in appearance. The uterus is grossly unremarkable. The patient's intrauterine device is noted in expected position. No suspicious adnexal masses are seen. The ovaries are relatively symmetric. Other: No additional soft tissue abnormalities are seen. Musculoskeletal: No acute osseous abnormalities are identified. The visualized musculature is unremarkable in appearance. IMPRESSION: Mild colonic wall thickening at the sigmoid colon, with mildly increased enhancement, concerning for a mild infectious or inflammatory process. No evidence of perforation or abscess formation at this time. Electronically Signed   By: Garald Balding M.D.   On: 12/11/2018 13:44    Procedures Procedures (including critical care time)  Medications Ordered in ED Medications  0.9 %  sodium chloride infusion ( Intravenous New Bag/Given 12/11/18 1414)  iopamidol (ISOVUE-300) 61 % injection 100 mL (has no administration in time range)  sodium chloride 0.9 % bolus 1,000 mL (0 mLs Intravenous Stopped 12/11/18 1417)  ondansetron (ZOFRAN) injection 4 mg (4 mg Intravenous Given 12/11/18 1241)  iohexol (OMNIPAQUE) 300 MG/ML solution 100 mL (100 mLs Intravenous Contrast Given 12/11/18 1306)     Initial Impression / Assessment and Plan / ED Course  I have reviewed the triage vital signs and the nursing notes.  Pertinent labs & imaging results that were available during my care of the  patient were reviewed by me and considered in my medical decision making (see chart for details).       Patient's crampy abdominal pain and urgency to have a bowel movement persisted.  So CT scan of the abdomen was done which showed inflammatory changes in the sigmoid area.  I feel that this is probably the cause of her symptoms.  Patient's vital signs and labs are stable.  Although patient has not had  a lot of loose bowel movements.  We will give her a prescription for Imodium.  Patient given referral to GI medicine.  Patient is followed by primary care upstairs so is followed by [facility] primary care so referred to [facility] gastroenterology.  Patient will return for any new or worse symptoms.  No significant leukocytosis.  Urine without any significant findings electrolytes without any significant abnormality.   Final Clinical Impressions(s) / ED Diagnoses   Final diagnoses:  Sigmoiditis    ED Discharge Orders         Ordered    loperamide (IMODIUM A-D) 2 MG tablet  4 times daily PRN     12/11/18 1545           Fredia Sorrow, MD 12/12/18 1651

## 2018-12-11 NOTE — ED Notes (Signed)
Intermittent abdominal cramping.

## 2018-12-11 NOTE — Telephone Encounter (Signed)
Patient is calling from work- she reports she is having a dizzy spell that is severe enough she needs assistance to walk. BP- 140/86,146/92 and patient does not take BP medication. Advised per protocol ED disposition- she agrees and will have another employee drive her.  Reason for Disposition . SEVERE dizziness (e.g., unable to stand, requires support to walk, feels like passing out now)  Answer Assessment - Initial Assessment Questions 1. DESCRIPTION: "Describe your dizziness."     faint 2. LIGHTHEADED: "Do you feel lightheaded?" (e.g., somewhat faint, woozy, weak upon standing)     Faint, weak with standing 3. VERTIGO: "Do you feel like either you or the room is spinning or tilting?" (i.e. vertigo)     no 4. SEVERITY: "How bad is it?"  "Do you feel like you are going to faint?" "Can you stand and walk?"   - MILD - walking normally   - MODERATE - interferes with normal activities (e.g., work, school)    - SEVERE - unable to stand, requires support to walk, feels like passing out now.      severe 5. ONSET:  "When did the dizziness begin?"     1 hour ago 6. AGGRAVATING FACTORS: "Does anything make it worse?" (e.g., standing, change in head position)     Came out of nowhere 7. HEART RATE: "Can you tell me your heart rate?" "How many beats in 15 seconds?"  (Note: not all patients can do this)       Not able to feel 8. CAUSE: "What do you think is causing the dizziness?"     unknown 9. RECURRENT SYMPTOM: "Have you had dizziness before?" If so, ask: "When was the last time?" "What happened that time?"     Had vertigo before- but did not feel quit like this 10. OTHER SYMPTOMS: "Do you have any other symptoms?" (e.g., fever, chest pain, vomiting, diarrhea, bleeding)       no 11. PREGNANCY: "Is there any chance you are pregnant?" "When was your last menstrual period?"       No-IUD  Protocols used: DIZZINESS [facility]

## 2018-12-11 NOTE — ED Triage Notes (Signed)
Lightheaded while at work.

## 2018-12-11 NOTE — Discharge Instructions (Addendum)
As we discussed CT scan showed inflammation in the sigmoid colon.  Is probably giving you the urge of needing to have a bowel movement.  If you start with diarrhea Imodium prescription was sent to the [facility] as you had listed.  It is also available over-the-counter.  Work note provided.  Rest the next 2 days.  Return for any new or worse symptoms.  Rest of your work-up without any significant findings.

## 2018-12-11 NOTE — ED Notes (Signed)
Denies dizziness

## 2018-12-13 ENCOUNTER — Ambulatory Visit: Payer: BLUE CROSS/BLUE SHIELD | Admitting: Family Medicine

## 2018-12-13 LAB — URINE CULTURE

## 2018-12-17 ENCOUNTER — Telehealth: Payer: Self-pay

## 2018-12-17 NOTE — Telephone Encounter (Signed)
Covid-19 travel screening questions  Have you traveled in the last 14 days? no If yes where?  Do you now or have you had a fever in the last 14 days? no  Do you have any respiratory symptoms of shortness of breath or cough now or in the last 14 days? no  Do you have any family members or close contacts with diagnosed or suspected Covid-19? no       

## 2018-12-18 ENCOUNTER — Other Ambulatory Visit: Payer: Self-pay

## 2018-12-18 ENCOUNTER — Encounter: Payer: Self-pay | Admitting: Gastroenterology

## 2018-12-18 ENCOUNTER — Ambulatory Visit: Payer: BLUE CROSS/BLUE SHIELD | Admitting: Gastroenterology

## 2018-12-18 VITALS — BP 112/70 | HR 60 | Temp 98.0°F | Ht 64.0 in | Wt 131.0 lb

## 2018-12-18 DIAGNOSIS — R933 Abnormal findings on diagnostic imaging of other parts of digestive tract: Secondary | ICD-10-CM

## 2018-12-18 MED ORDER — NA SULFATE-K SULFATE-MG SULF 17.5-3.13-1.6 GM/177ML PO SOLN: 1.0000 | Freq: Once | ORAL | 0 refills | Status: AC

## 2018-12-18 MED ORDER — DICYCLOMINE HCL 10 MG PO CAPS: 10.0000 mg | ORAL_CAPSULE | Freq: Three times a day (TID) | ORAL | 1 refills | Status: DC

## 2018-12-18 NOTE — Patient Instructions (Addendum)
We have sent the following medications to your pharmacy for you to pick up at your convenience:dicyclomine.   You have been scheduled for a colonoscopy. Please follow written instructions given to you at your visit today.  Please pick up your prep supplies at the pharmacy within the next 1-3 days. If you use inhalers (even only as needed), please bring them with you on the day of your procedure. Your physician has requested that you go to www.startemmi.com and enter the access code given to you at your visit today. This web site gives a general overview about your procedure. However, you should still follow specific instructions given to you by our office regarding your preparation for the procedure.  Thank you for choosing me and [facility].  Pricilla Riffle. Dagoberto Ligas., MD., [facility]  To help prevent the possible spread of infection to our patients, communities, and staff; we will be implementing the following measures:  Please only allow one visitor/family member to accompany you to any upcoming appointments with [facility]. If you have any concerns about this please contact our office to discuss prior to the appointment.

## 2018-12-18 NOTE — Progress Notes (Signed)
History of Present Illness: This is a 48 year old female referred by Carollee Herter, Alferd Apa, * DO for the evaluation of periumbilical abdominal pain, associated with cramping and urgent stools.  She relates the acute onset of symptoms on March 11 and she presented to [facility] ED. Blood work was unremarkable.  Abdominal pelvic CT as below.  Her symptoms have steadily and substantially improved although she still noted intermittent mild crampy periumbilical and RLQ pain associated with bowel movements for the past couple days. Denies weight loss, constipation, diarrhea, change in stool caliber, melena, hematochezia, nausea, vomiting, dysphagia, reflux symptoms, chest pain.    Abd/pelvic CT 12/11/2018 Mild colonic wall thickening at the sigmoid colon, with mildly increased enhancement, concerning for a mild infectious or inflammatory process. No evidence of perforation or abscess formation at this time.    No Known Allergies Outpatient Medications Prior to Visit  Medication Sig Dispense Refill  . Biotin 1000 MCG tablet Take 1,000 mcg by mouth daily.    . cholecalciferol (VITAMIN D) 1000 UNITS tablet Take 1,000 Units by mouth daily.    . fluticasone (FLONASE) 50 MCG/ACT nasal spray Place 2 sprays into both nostrils daily. 16 g 6  . levonorgestrel (MIRENA) 20 MCG/24HR IUD 1 each by Intrauterine route once.    . montelukast (SINGULAIR) 10 MG tablet Take 1 tablet (10 mg total) by mouth at bedtime. 30 tablet 5  . cetirizine (ZYRTEC) 10 MG tablet Take 1 tablet (10 mg total) by mouth daily. 30 tablet 6  . ECHINACEA PO Take 1 tablet by mouth daily.    . hydrOXYzine (ATARAX/VISTARIL) 25 MG tablet Take 1 tablet (25 mg total) by mouth every 8 (eight) hours as needed for itching. 21 tablet 0  . loperamide (IMODIUM A-D) 2 MG tablet Take 1 tablet (2 mg total) by mouth 4 (four) times daily as needed for diarrhea or loose stools. 30 tablet 0  . magnesium oxide (MAG-OX) 400 MG tablet Take 400 mg by mouth daily.     . meclizine (ANTIVERT) 25 MG tablet Take 1 tablet (25 mg total) by mouth 3 (three) times daily as needed for dizziness. 30 tablet 0  . metoCLOPramide (REGLAN) 10 MG tablet Take 10 mg by mouth 4 (four) times daily.    . predniSONE (DELTASONE) 10 MG tablet 6 TAB PO DAY 1 5 TAB PO DAY 2 4 TAB PO DAY 3 3 TAB PO DAY 4 2 TAB PO DAY 5 1 TAB PO DAY 6 21 tablet 0  . RELPAX 40 MG tablet Take 1 tablet by mouth as needed.    . Riboflavin (B-2) 100 MG TABS Take by mouth.     No facility-administered medications prior to visit.    Past Medical History:  Diagnosis Date  . Diverticulitis   . Migraine    No past surgical history on file. Social History   Socioeconomic History  . Marital status: Single    Spouse name: Not on file  . Number of children: Not on file  . Years of education: Not on file  . Highest education level: Not on file  Occupational History  . Not on file  Social Needs  . Financial resource strain: Not on file  . Food insecurity:    Worry: Not on file    Inability: Not on file  . Transportation needs:    Medical: Not on file    Non-medical: Not on file  Tobacco Use  . Smoking status: Never Smoker  . Smokeless  tobacco: Never Used  Substance and Sexual Activity  . Alcohol use: Not Currently  . Drug use: No  . Sexual activity: Not Currently  Lifestyle  . Physical activity:    Days per week: Not on file    Minutes per session: Not on file  . Stress: Not on file  Relationships  . Social connections:    Talks on phone: Not on file    Gets together: Not on file    Attends religious service: Not on file    Active member of club or organization: Not on file    Attends meetings of clubs or organizations: Not on file    Relationship status: Not on file  Other Topics Concern  . Not on file  Social History Narrative   Exercise- no   Family History  Problem Relation Age of Onset  . Hyperlipidemia Mother   . Hypertension Mother        Review of Systems:  Pertinent positive and negative review of systems were noted in the above HPI section. All other review of systems were otherwise negative.    Physical Exam: General: Well developed, well nourished, no acute distress Head: Normocephalic and atraumatic Eyes:  sclerae anicteric, EOMI Ears: Normal auditory acuity Mouth: No deformity or lesions Neck: Supple, no masses or thyromegaly Lungs: Clear throughout to auscultation Heart: Regular rate and rhythm; no murmurs, rubs or bruits Abdomen: Soft, non tender and non distended. No masses, hepatosplenomegaly or hernias noted. Normal Bowel sounds Rectal: Deferred to colonoscopy Musculoskeletal: Symmetrical with no gross deformities  Skin: No lesions on visible extremities Pulses:  Normal pulses noted Extremities: No clubbing, cyanosis, edema or deformities noted Neurological: Alert oriented x 4, grossly nonfocal Cervical Nodes:  No significant cervical adenopathy Inguinal Nodes: No significant inguinal adenopathy Psychological:  Alert and cooperative. Normal mood and affect   Assessment and Recommendations:  1.  Periumbilical abdominal pain, cramping, urgency, abnormal CT scan of the sigmoid colon. Suspected self-limited colitis such as infection or ischemia.  Rule out IBD, neoplasm, etc.  Dicyclomine 10 mg 3 times daily as needed.  Low-fat low fiber diet for now.  Schedule colonoscopy. The risks (including bleeding, perforation, infection, missed lesions, medication reactions and possible hospitalization or surgery if complications occur), benefits, and alternatives to colonoscopy with possible biopsy and possible polypectomy were discussed with the patient and they consent to proceed.     cc: Ann Held, DO [address] [place], [place] [address]

## 2018-12-19 ENCOUNTER — Telehealth: Payer: Self-pay | Admitting: Gastroenterology

## 2018-12-19 MED ORDER — NA SULFATE-K SULFATE-MG SULF 17.5-3.13-1.6 GM/177ML PO SOLN: 1.0000 | Freq: Once | ORAL | 0 refills | Status: AC

## 2018-12-19 NOTE — Telephone Encounter (Signed)
Informed patient that I will send her Suprep to [facility]. Also that the dicyclomine is a anti-spasmodic and can be used for her abdominal cramping/pain. Patient verbalized understanding.

## 2018-12-19 NOTE — Telephone Encounter (Signed)
Pt called stating that she did not recv her prep. She also wants to know if it can be called in to [facility] Address: [address], [place], [place] [address] Phone: [phone]  and she also wants to speak with someone about dicyclomine (BENTYL) 10 MG capsule [[id number]]  Which was called in and she is not sure what it is for

## 2018-12-22 ENCOUNTER — Telehealth: Payer: Self-pay

## 2018-12-22 NOTE — Telephone Encounter (Signed)
Covid-19 travel screening questions  Have you traveled in the last 14 days? If yes where? No.  Do you now or have you had a fever in the last 14 days? No.  Do you have any respiratory symptoms of shortness of breath or cough now or in the last 14 days? No.  Do you have a medical history of Congestive Heart Failure?  Do you have a medical history of lung disease?  Do you have any family members or close contacts with diagnosed or suspected Covid-19? No.   Patient agrees and understands about our "No care partner in the lobby policy". She had no further questions.

## 2018-12-23 ENCOUNTER — Other Ambulatory Visit: Payer: Self-pay

## 2018-12-23 ENCOUNTER — Ambulatory Visit (AMBULATORY_SURGERY_CENTER): Payer: BLUE CROSS/BLUE SHIELD | Admitting: Gastroenterology

## 2018-12-23 ENCOUNTER — Encounter: Payer: Self-pay | Admitting: Gastroenterology

## 2018-12-23 VITALS — BP 121/76 | HR 72 | Temp 97.7°F | Resp 14 | Ht 64.0 in | Wt 131.0 lb

## 2018-12-23 DIAGNOSIS — R933 Abnormal findings on diagnostic imaging of other parts of digestive tract: Secondary | ICD-10-CM | POA: Diagnosis not present

## 2018-12-23 DIAGNOSIS — K635 Polyp of colon: Secondary | ICD-10-CM

## 2018-12-23 DIAGNOSIS — D120 Benign neoplasm of cecum: Secondary | ICD-10-CM

## 2018-12-23 DIAGNOSIS — K648 Other hemorrhoids: Secondary | ICD-10-CM

## 2018-12-23 DIAGNOSIS — R1033 Periumbilical pain: Secondary | ICD-10-CM

## 2018-12-23 MED ORDER — SODIUM CHLORIDE 0.9 % IV SOLN: 500.0000 mL | Freq: Once | INTRAVENOUS | Status: DC

## 2018-12-23 NOTE — Op Note (Signed)
[facility] Patient Name: Marilyn Green Procedure Date: 12/23/2018 8:10 AM MRN: [id number] Endoscopist: Ladene Artist , MD Age: 48 Referring MD:  Date of Birth: 1971/01/26 Gender: Female Account #: [id number] Procedure:                Colonoscopy Indications:              Periumbilical abdominal pain, Abnormal CT of the GI                            tract Medicines:                Monitored Anesthesia Care Procedure:                Pre-Anesthesia Assessment:                           - Prior to the procedure, a History and Physical                            was performed, and patient medications and                            allergies were reviewed. The patient's tolerance of                            previous anesthesia was also reviewed. The risks                            and benefits of the procedure and the sedation                            options and risks were discussed with the patient.                            All questions were answered, and informed consent                            was obtained. Prior Anticoagulants: The patient has                            taken no previous anticoagulant or antiplatelet                            agents. ASA Grade Assessment: II - A patient with                            mild systemic disease. After reviewing the risks                            and benefits, the patient was deemed in                            satisfactory condition to undergo the procedure.  After obtaining informed consent, the colonoscope                            was passed under direct vision. Throughout the                            procedure, the patient's blood pressure, pulse, and                            oxygen saturations were monitored continuously. The                            Colonoscope was introduced through the anus and                            advanced to the the cecum, identified by                   appendiceal orifice and ileocecal valve. The                            ileocecal valve, appendiceal orifice, and rectum                            were photographed. The quality of the bowel                            preparation was excellent. The colonoscopy was                            performed without difficulty. The patient tolerated                            the procedure well. Scope In: 8:15:11 AM Scope Out: 8:31:40 AM Scope Withdrawal Time: 0 hours 13 minutes 42 seconds  Total Procedure Duration: 0 hours 16 minutes 29 seconds  Findings:                 The perianal and digital rectal examinations were                            normal.                           A 8 mm polyp was found in the cecum. The polyp was                            sessile. The polyp was removed with a hot snare.                            Resection and retrieval were complete.                           Internal hemorrhoids were found during  retroflexion. The hemorrhoids were small and Grade                            I (internal hemorrhoids that do not prolapse).                           The exam was otherwise without abnormality on                            direct and retroflexion views. Complications:            No immediate complications. Estimated blood loss:                            None. Estimated Blood Loss:     Estimated blood loss: none. Impression:               - One 8 mm polyp in the cecum, removed with a hot                            snare. Resected and retrieved.                           - Internal hemorrhoids.                           - The examination was otherwise normal on direct                            and retroflexion views. Recommendation:           - Repeat colonoscopy date to be determined after                            pending pathology results are reviewed.                           - Patient has a contact number  available for                            emergencies. The signs and symptoms of potential                            delayed complications were discussed with the                            patient. Return to normal activities tomorrow.                            Written discharge instructions were provided to the                            patient.                           - Resume previous diet.                           -  Continue present medications.                           - Await pathology results.                           - No aspirin, ibuprofen, naproxen, or other                            non-steroidal anti-inflammatory drugs for 2 weeks                            after polyp removal. Ladene Artist, MD 12/23/2018 8:37:08 AM This report has been signed electronically.

## 2018-12-23 NOTE — Progress Notes (Signed)
A/ox3, pleased with MAC, report to RN 

## 2018-12-23 NOTE — Patient Instructions (Signed)
No Aspirin, ibuprofen, naproxen, or other NSAIDs for 2 weeks after polyp removal.   YOU HAD AN ENDOSCOPIC PROCEDURE TODAY AT [facility]:   Refer to the procedure report that was given to you for any specific questions about what was found during the examination.  If the procedure report does not answer your questions, please call your gastroenterologist to clarify.  If you requested that your care partner not be given the details of your procedure findings, then the procedure report has been included in a sealed envelope for you to review at your convenience later.  YOU SHOULD EXPECT: Some feelings of bloating in the abdomen. Passage of more gas than usual.  Walking can help get rid of the air that was put into your GI tract during the procedure and reduce the bloating. If you had a lower endoscopy (such as a colonoscopy or flexible sigmoidoscopy) you may notice spotting of blood in your stool or on the toilet paper. If you underwent a bowel prep for your procedure, you may not have a normal bowel movement for a few days.  Please Note:  You might notice some irritation and congestion in your nose or some drainage.  This is from the oxygen used during your procedure.  There is no need for concern and it should clear up in a day or so.  SYMPTOMS TO REPORT IMMEDIATELY:   Following lower endoscopy (colonoscopy or flexible sigmoidoscopy):  Excessive amounts of blood in the stool  Significant tenderness or worsening of abdominal pains  Swelling of the abdomen that is new, acute  Fever of 100F or higher  For urgent or emergent issues, a gastroenterologist can be reached at any hour by calling [phone].   DIET:  We do recommend a small meal at first, but then you may proceed to your regular diet.  Drink plenty of fluids but you should avoid alcoholic beverages for 24 hours.  ACTIVITY:  You should plan to take it easy for the rest of today and you should NOT DRIVE or use heavy  machinery until tomorrow (because of the sedation medicines used during the test).    FOLLOW UP: Our staff will call the number listed on your records the next business day following your procedure to check on you and address any questions or concerns that you may have regarding the information given to you following your procedure. If we do not reach you, we will leave a message.  However, if you are feeling well and you are not experiencing any problems, there is no need to return our call.  We will assume that you have returned to your regular daily activities without incident.  If any biopsies were taken you will be contacted by phone or by letter within the next 1-3 weeks.  Please call us at [phone] if you have not heard about the biopsies in 3 weeks.    SIGNATURES/CONFIDENTIALITY: You and/or your care partner have signed paperwork which will be entered into your electronic medical record.  These signatures attest to the fact that that the information above on your After Visit Summary has been reviewed and is understood.  Full responsibility of the confidentiality of this discharge information lies with you and/or your care-partner.

## 2018-12-24 ENCOUNTER — Telehealth: Payer: Self-pay

## 2018-12-24 ENCOUNTER — Telehealth: Payer: Self-pay | Admitting: *Deleted

## 2018-12-24 NOTE — Telephone Encounter (Signed)
  Follow up Call-  Call back number 12/23/2018  Post procedure Call Back phone  # [phone]  Permission to leave phone message Yes  Some recent data might be hidden     Patient questions:  Do you have a fever, pain , or abdominal swelling? No. Pain Score  0 *  Have you tolerated food without any problems? Yes.    Have you been able to return to your normal activities? Yes.    Do you have any questions about your discharge instructions: Diet   No. Medications  No. Follow up visit  No.  Do you have questions or concerns about your Care? No.  Actions: * If pain score is 4 or above: No action needed, pain <4.

## 2018-12-24 NOTE — Telephone Encounter (Signed)
  Follow up Call-  Call back number 12/23/2018  Post procedure Call Back phone  # [phone]  Permission to leave phone message Yes  Some recent data might be hidden     Patient questions:  Do you have a fever, pain , or abdominal swelling? No. Pain Score  0 *  Have you tolerated food without any problems? Yes.    Have you been able to return to your normal activities? Yes.    Do you have any questions about your discharge instructions: Diet   No. Medications  No. Follow up visit  No.  Do you have questions or concerns about your Care? Yes.    Actions: * If pain score is 4 or above: No action needed, pain <4.

## 2018-12-25 ENCOUNTER — Encounter: Payer: Self-pay | Admitting: Gastroenterology

## 2019-10-14 DIAGNOSIS — Z6822 Body mass index (BMI) 22.0-22.9, adult: Secondary | ICD-10-CM | POA: Diagnosis not present

## 2019-10-14 DIAGNOSIS — Z01419 Encounter for gynecological examination (general) (routine) without abnormal findings: Secondary | ICD-10-CM | POA: Diagnosis not present

## 2019-10-14 DIAGNOSIS — Z130 Encounter for screening for diseases of the blood and blood-forming organs and certain disorders involving the immune mechanism: Secondary | ICD-10-CM | POA: Diagnosis not present

## 2019-10-14 DIAGNOSIS — Z124 Encounter for screening for malignant neoplasm of cervix: Secondary | ICD-10-CM | POA: Diagnosis not present

## 2019-10-14 DIAGNOSIS — Z975 Presence of (intrauterine) contraceptive device: Secondary | ICD-10-CM | POA: Diagnosis not present

## 2019-10-14 DIAGNOSIS — Z1151 Encounter for screening for human papillomavirus (HPV): Secondary | ICD-10-CM | POA: Diagnosis not present

## 2019-10-14 DIAGNOSIS — Z1231 Encounter for screening mammogram for malignant neoplasm of breast: Secondary | ICD-10-CM | POA: Diagnosis not present

## 2020-10-14 DIAGNOSIS — Z130 Encounter for screening for diseases of the blood and blood-forming organs and certain disorders involving the immune mechanism: Secondary | ICD-10-CM | POA: Diagnosis not present

## 2020-10-14 DIAGNOSIS — Z1231 Encounter for screening mammogram for malignant neoplasm of breast: Secondary | ICD-10-CM | POA: Diagnosis not present

## 2020-10-14 DIAGNOSIS — R3121 Asymptomatic microscopic hematuria: Secondary | ICD-10-CM | POA: Diagnosis not present

## 2020-10-14 DIAGNOSIS — Z975 Presence of (intrauterine) contraceptive device: Secondary | ICD-10-CM | POA: Diagnosis not present

## 2020-10-14 DIAGNOSIS — Z6822 Body mass index (BMI) 22.0-22.9, adult: Secondary | ICD-10-CM | POA: Diagnosis not present

## 2020-10-14 DIAGNOSIS — Z01419 Encounter for gynecological examination (general) (routine) without abnormal findings: Secondary | ICD-10-CM | POA: Diagnosis not present

## 2020-11-18 DIAGNOSIS — R311 Benign essential microscopic hematuria: Secondary | ICD-10-CM | POA: Diagnosis not present

## 2020-11-18 IMAGING — CT CT ABDOMEN AND PELVIS WITH CONTRAST
2 of 5 series · 16 of 46 positions shown, 18 images · IV contrast (omnipaque)
Comparison: Lumbar spine radiographs performed 08/28/2016

CLINICAL DATA: Acute onset of generalized abdominal cramping,
diarrhea and dizziness.

EXAM:
CT ABDOMEN AND PELVIS WITH CONTRAST
TECHNIQUE: Multidetector CT imaging of the abdomen and pelvis was performed
using the standard protocol following bolus administration of
intravenous contrast.
CONTRAST:  100mL OMNIPAQUE IOHEXOL 300 MG/ML  SOLN

[Series 2: axial st · axial · 0.84mm/px · z∈[-455,-95]mm · 13 of 80 slices shown, 15 images]
[im 4/80  soft-tissue]
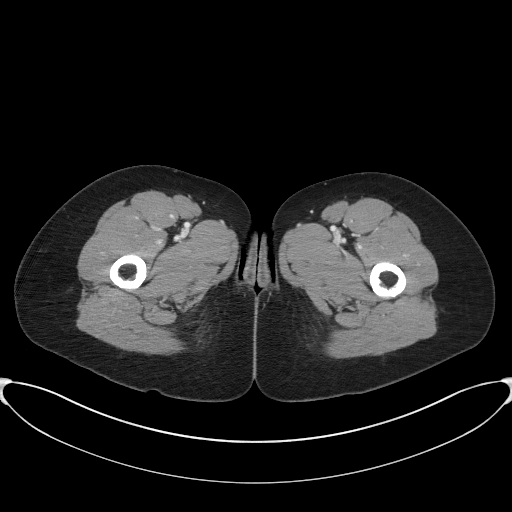
[im 4/80  bone]
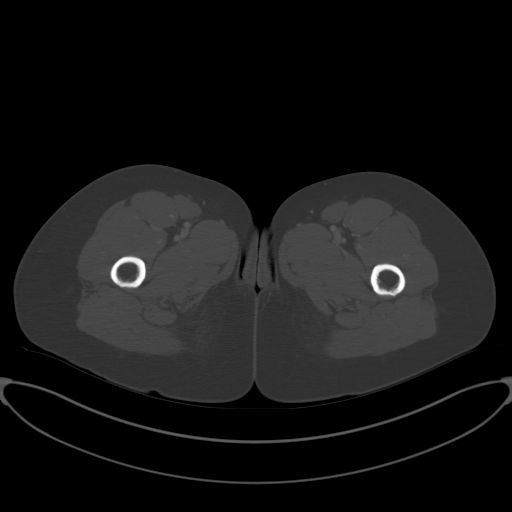
[im 12/80  soft-tissue]
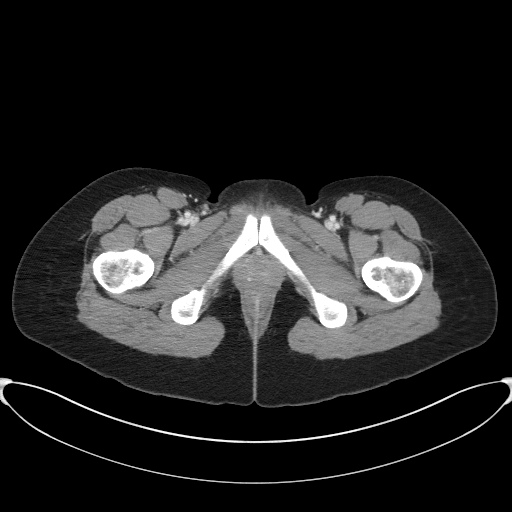
[im 16/80  soft-tissue]
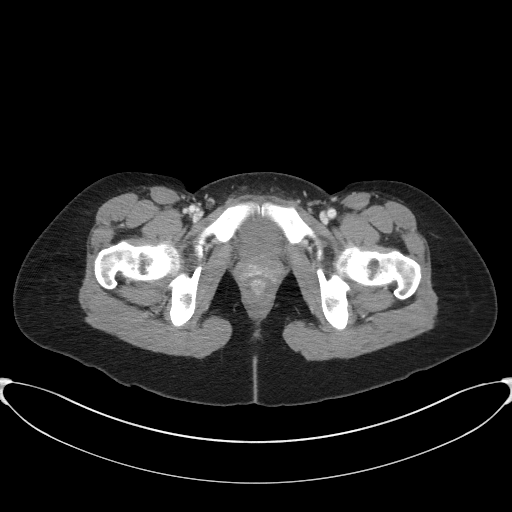
[im 24/80  soft-tissue]
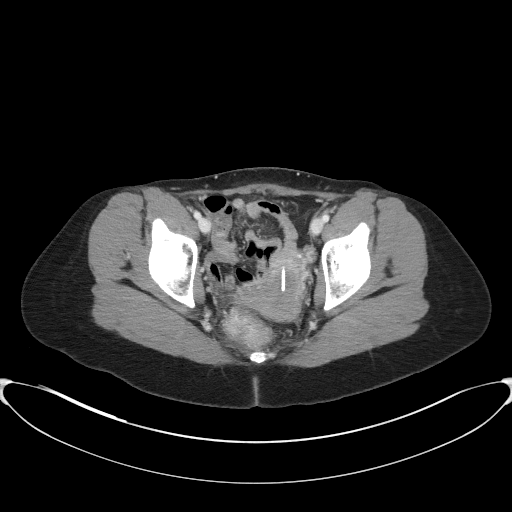
[im 28/80  soft-tissue]
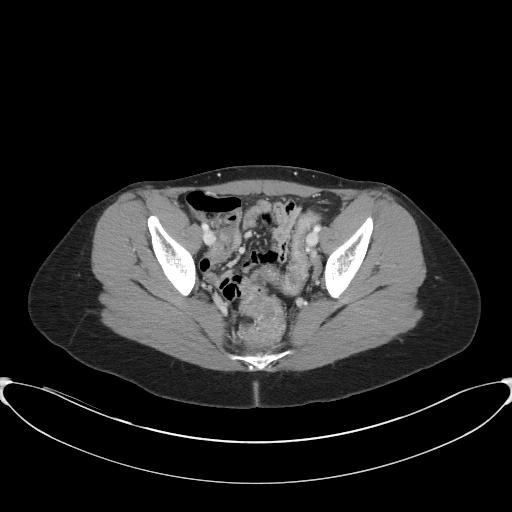
[im 36/80  soft-tissue]
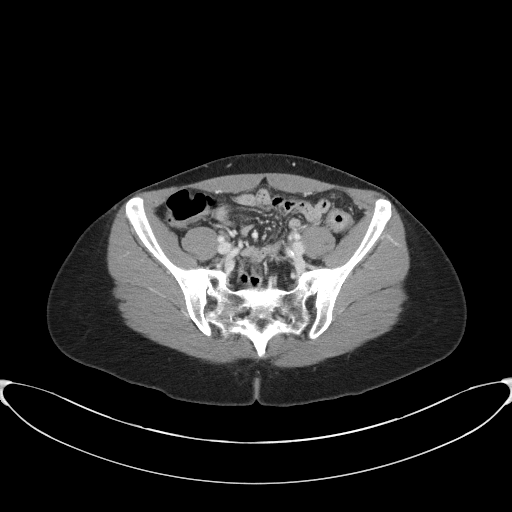
[im 40/80  soft-tissue]
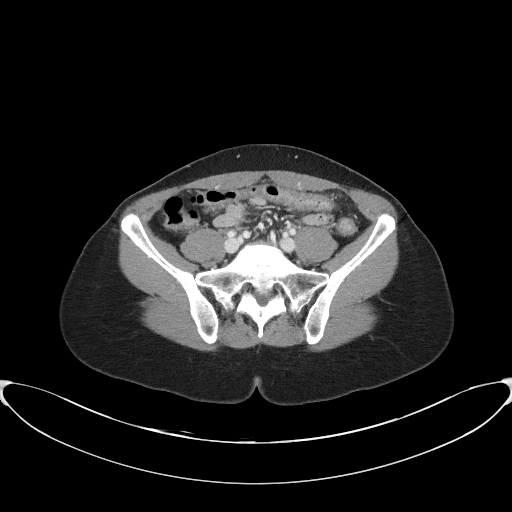
[im 44/80  soft-tissue]
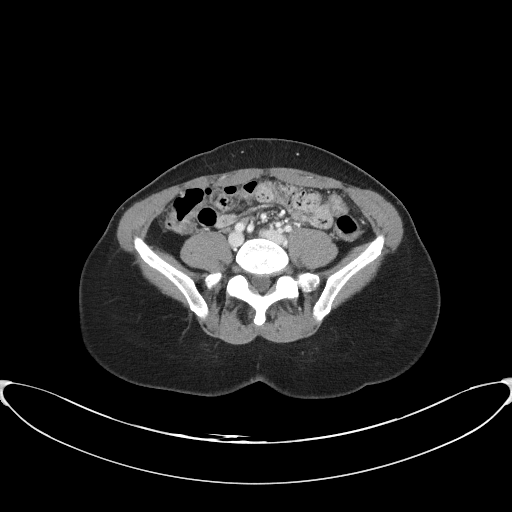
[im 52/80  soft-tissue]
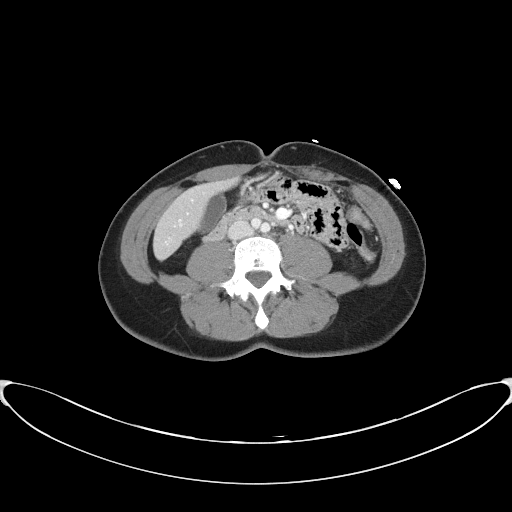
[im 52/80  bone]
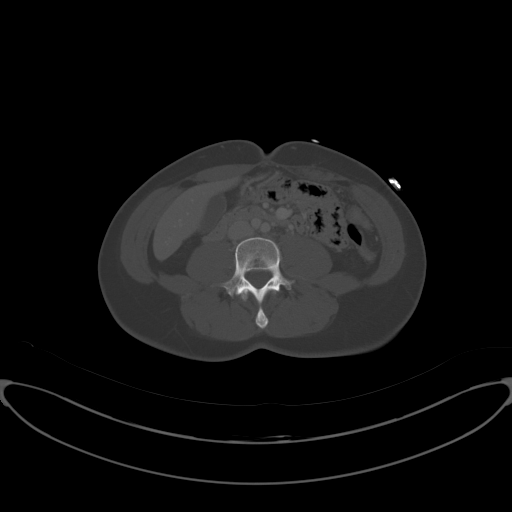
[im 56/80  soft-tissue]
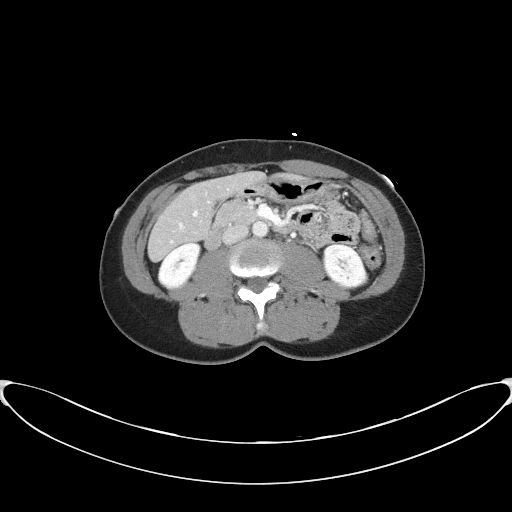
[im 64/80  soft-tissue]
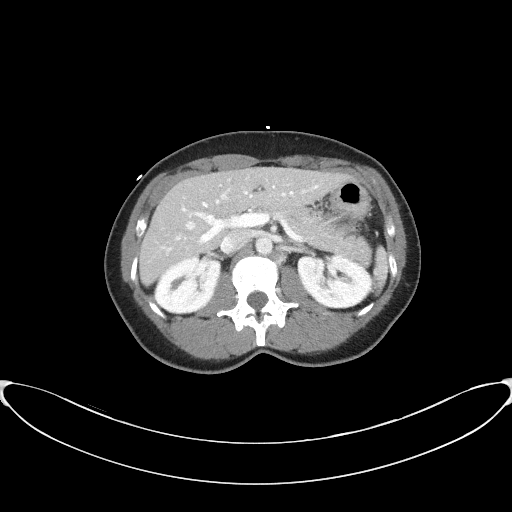
[im 68/80  soft-tissue]
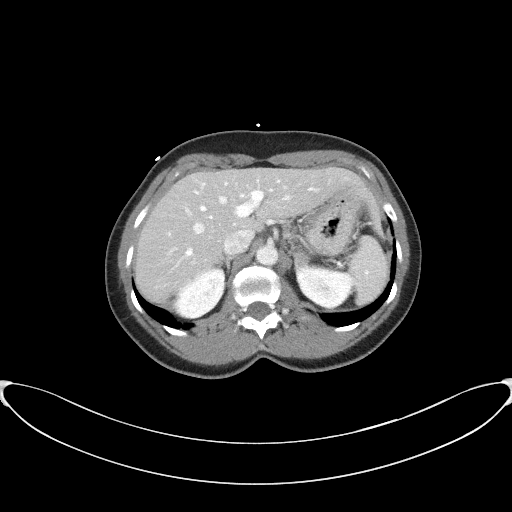
[im 76/80  soft-tissue]
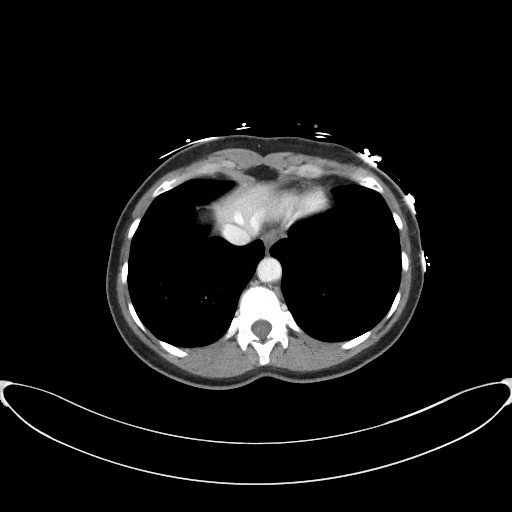

[Series 5: coronal st · coronal · 0.79mm/px · 3 of 77 slices shown]
[im 26/77  soft-tissue]
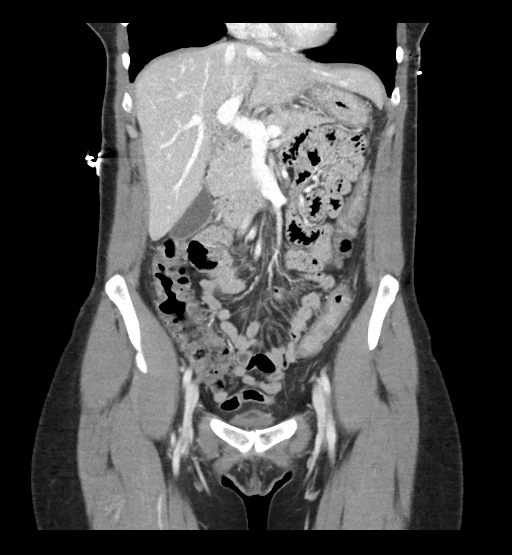
[im 34/77  soft-tissue]
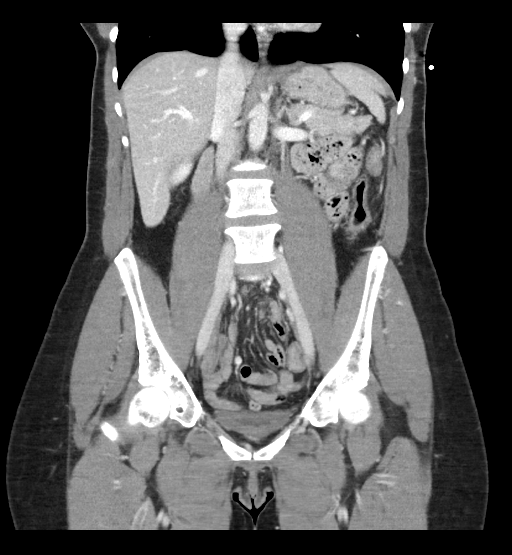
[im 43/77  soft-tissue]
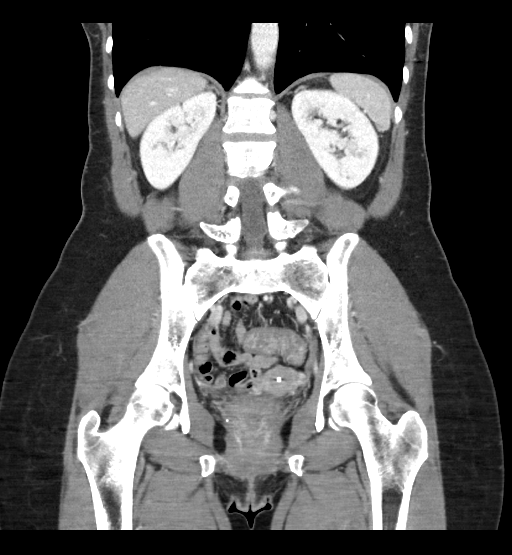

[16 of 46 positions shown; findings below may reference images not displayed]

FINDINGS: Lower chest: The visualized lung bases are grossly clear. The
visualized portions of the mediastinum are unremarkable.

Hepatobiliary: The liver is unremarkable in appearance. The
gallbladder is unremarkable in appearance. The common bile duct
remains normal in caliber.

Pancreas: The pancreas is within normal limits.

Spleen: The spleen is unremarkable in appearance.

Adrenals/Urinary Tract: The adrenal glands are unremarkable in
appearance. The kidneys are within normal limits. There is no
evidence of hydronephrosis. No renal or ureteral stones are
identified. No perinephric stranding is seen.

Stomach/Bowel: The stomach is unremarkable in appearance. The small
bowel is within normal limits. The appendix is normal in caliber,
without evidence of appendicitis.

Mild colonic wall thickening is noted at the sigmoid colon, with
mildly increased enhancement, concerning for a mild infectious or
inflammatory process. There is no evidence of perforation or abscess
formation at this time.

Vascular/Lymphatic: The abdominal aorta is unremarkable in
appearance. The inferior vena cava is grossly unremarkable. No
retroperitoneal lymphadenopathy is seen. No pelvic sidewall
lymphadenopathy is identified.

Reproductive: The bladder is decompressed and grossly unremarkable
in appearance. The uterus is grossly unremarkable. The patient's
intrauterine device is noted in expected position. No suspicious
adnexal masses are seen. The ovaries are relatively symmetric.

Other: No additional soft tissue abnormalities are seen.

Musculoskeletal: No acute osseous abnormalities are identified. The
visualized musculature is unremarkable in appearance.
IMPRESSION: Mild colonic wall thickening at the sigmoid colon, with mildly
increased enhancement, concerning for a mild infectious or
inflammatory process. No evidence of perforation or abscess
formation at this time.

## 2020-11-26 DIAGNOSIS — R311 Benign essential microscopic hematuria: Secondary | ICD-10-CM | POA: Diagnosis not present

## 2020-11-26 DIAGNOSIS — N281 Cyst of kidney, acquired: Secondary | ICD-10-CM | POA: Diagnosis not present

## 2020-11-26 DIAGNOSIS — R3129 Other microscopic hematuria: Secondary | ICD-10-CM | POA: Diagnosis not present

## 2020-12-13 DIAGNOSIS — R311 Benign essential microscopic hematuria: Secondary | ICD-10-CM | POA: Diagnosis not present

## 2020-12-13 DIAGNOSIS — N281 Cyst of kidney, acquired: Secondary | ICD-10-CM | POA: Diagnosis not present

## 2020-12-16 ENCOUNTER — Other Ambulatory Visit: Payer: Self-pay | Admitting: Urology

## 2020-12-24 ENCOUNTER — Encounter (HOSPITAL_COMMUNITY): Admission: RE | Admit: 2020-12-24 | Payer: BC Managed Care – PPO | Source: Ambulatory Visit

## 2020-12-24 ENCOUNTER — Other Ambulatory Visit (HOSPITAL_COMMUNITY): Payer: BLUE CROSS/BLUE SHIELD

## 2020-12-28 ENCOUNTER — Ambulatory Visit: Admit: 2020-12-28 | Payer: BC Managed Care – PPO | Admitting: Urology

## 2020-12-28 SURGERY — CYSTOSCOPY, WITH URETHRAL DILATION
Anesthesia: General

## 2021-04-09 DIAGNOSIS — L259 Unspecified contact dermatitis, unspecified cause: Secondary | ICD-10-CM | POA: Diagnosis not present

## 2021-04-12 ENCOUNTER — Telehealth: Payer: Self-pay

## 2021-04-12 NOTE — Telephone Encounter (Signed)
Patient called asking if she could re-establish care with PCP.  Please advise.

## 2021-04-19 NOTE — Telephone Encounter (Signed)
Lvm to call back and schedule appt to get reestablished!

## 2021-04-25 ENCOUNTER — Other Ambulatory Visit: Payer: Self-pay

## 2021-04-26 ENCOUNTER — Telehealth: Payer: Self-pay | Admitting: Family Medicine

## 2021-04-26 ENCOUNTER — Encounter: Payer: Self-pay | Admitting: Family Medicine

## 2021-04-26 ENCOUNTER — Ambulatory Visit: Payer: BC Managed Care – PPO | Admitting: Family Medicine

## 2021-04-26 VITALS — BP 140/80 | HR 89 | Temp 98.1°F | Resp 18 | Ht 64.0 in | Wt 131.4 lb

## 2021-04-26 DIAGNOSIS — Z23 Encounter for immunization: Secondary | ICD-10-CM

## 2021-04-26 DIAGNOSIS — H9203 Otalgia, bilateral: Secondary | ICD-10-CM | POA: Diagnosis not present

## 2021-04-26 MED ORDER — ACETIC ACID 2 % OT SOLN: 4.0000 [drp] | Freq: Three times a day (TID) | OTIC | 0 refills | Status: DC

## 2021-04-26 NOTE — Telephone Encounter (Signed)
Patient would like to know if she needs any labs

## 2021-04-26 NOTE — Progress Notes (Addendum)
Subjective:   By signing my name below, I, Shehryar Baig, attest that this documentation has been prepared under the direction and in the presence of Dr. Roma Schanz, DO. 04/26/2021    Patient ID: Marilyn Green, female    DOB: [birth year], 50 y.o.   MRN: [id number]  Chief Complaint  Patient presents with   Establish Care    Pt states would like discuss itchy ears    HPI Patient is in today for a new patient visit. She complains of itchy ears. She describes the itchiness deep in her ears and gets relief from using a q-tip and pressing on her jaw. She mentions that there has never had discharge from her ears.  Her blood pressure is elevated during this visit. She mentions her blood pressure has been jumping up and down lately. She notes having increased stress at her work.  BP Readings from Last 3 Encounters:  04/26/21 140/80  12/23/18 121/76  12/18/18 112/70   She has no change in her family history since her last visit. She has no surgeries since her last visit. She does not drink alcohol. She does not use drugs. She does not use tobacco products. She is not sexually active at this time.  She does not participate in regular exercise. She is due for the shingles vaccine and is not interested in getting it at this time. She is due for a tetanus vaccine and is willing to get one after this visit. She has 2 Covid-19 vaccines at this time.   Past Medical History:  Diagnosis Date   Allergy    Diverticulitis    Migraine     History reviewed. No pertinent surgical history.  Family History  Problem Relation Age of Onset   Hyperlipidemia Mother    Hypertension Mother    Colon cancer Neg Hx    Esophageal cancer Neg Hx    Rectal cancer Neg Hx    Stomach cancer Neg Hx     Social History   Socioeconomic History   Marital status: Single    Spouse name: Not on file   Number of children: Not on file   Years of education: Not on file   Highest education level: Not on file   Occupational History   Not on file  Tobacco Use   Smoking status: Never   Smokeless tobacco: Never  Vaping Use   Vaping Use: Never used  Substance and Sexual Activity   Alcohol use: Not Currently   Drug use: No   Sexual activity: Not Currently  Other Topics Concern   Not on file  Social History Narrative   Exercise- no   Social Determinants of Health   Financial Resource Strain: Not on file  Food Insecurity: Not on file  Transportation Needs: Not on file  Physical Activity: Not on file  Stress: Not on file  Social Connections: Not on file  Intimate Partner Violence: Not on file    Outpatient Medications Prior to Visit  Medication Sig Dispense Refill   Biotin 1000 MCG tablet Take 1,000 mcg by mouth daily.     Cholecalciferol (VITAMIN D) 50 MCG (2000 UT) CAPS Take 2,000 Units by mouth daily.     levonorgestrel (MIRENA) 20 MCG/24HR IUD 1 each by Intrauterine route once.     No facility-administered medications prior to visit.    No Known Allergies  Review of Systems  Constitutional:  Negative for fever and malaise/fatigue.  HENT:  Negative for congestion.   Eyes:  Negative  for blurred vision.  Respiratory:  Negative for cough and shortness of breath.   Cardiovascular:  Negative for chest pain, palpitations and leg swelling.  Gastrointestinal:  Negative for vomiting.  Musculoskeletal:  Negative for back pain.  Skin:  Positive for itching (deep In ears). Negative for rash.  Neurological:  Negative for loss of consciousness and headaches.      Objective:    Physical Exam Vitals and nursing note reviewed.  Constitutional:      General: She is not in acute distress.    Appearance: Normal appearance. She is not ill-appearing.  HENT:     Head: Normocephalic and atraumatic.     Right Ear: Tympanic membrane, ear canal and external ear normal.     Left Ear: Tympanic membrane, ear canal and external ear normal.     Ears:     Comments: Ears were mildly dry with flaky  skin No errythema  Eyes:     Extraocular Movements: Extraocular movements intact.     Pupils: Pupils are equal, round, and reactive to light.  Cardiovascular:     Rate and Rhythm: Regular rhythm.     Pulses: Normal pulses.     Heart sounds: Normal heart sounds. No murmur heard.   No gallop.  Pulmonary:     Effort: Pulmonary effort is normal. No respiratory distress.     Breath sounds: Normal breath sounds. No wheezing or rales.  Skin:    General: Skin is warm and dry.     Findings: No rash.  Neurological:     Mental Status: She is alert and oriented to person, place, and time.  Psychiatric:        Behavior: Behavior normal.    BP 140/80 (BP Location: Right Arm, Patient Position: Sitting, Cuff Size: Normal)   Pulse 89   Temp 98.1 F (36.7 C) (Oral)   Resp 18   Ht '5\' 4"'$  (1.626 m)   Wt 131 lb 6.4 oz (59.6 kg)   SpO2 99%   BMI 22.55 kg/m  Wt Readings from Last 3 Encounters:  04/26/21 131 lb 6.4 oz (59.6 kg)  12/23/18 131 lb (59.4 kg)  12/18/18 131 lb (59.4 kg)    Diabetic Foot Exam - Simple   No data filed    Lab Results  Component Value Date   WBC 5.4 12/11/2018   HGB 13.7 12/11/2018   HCT 44.3 12/11/2018   PLT 217 12/11/2018   GLUCOSE 105 (H) 12/11/2018   CHOL 196 08/13/2013   TRIG 40.0 08/13/2013   HDL 61.30 08/13/2013   LDLCALC 127 (H) 08/13/2013   ALT 15 12/11/2018   AST 21 12/11/2018   NA 136 12/11/2018   K 3.7 12/11/2018   CL 101 12/11/2018   CREATININE 0.80 12/11/2018   BUN 15 12/11/2018   CO2 26 12/11/2018   TSH 1.99 08/13/2013    Lab Results  Component Value Date   TSH 1.99 08/13/2013   Lab Results  Component Value Date   WBC 5.4 12/11/2018   HGB 13.7 12/11/2018   HCT 44.3 12/11/2018   MCV 89.0 12/11/2018   PLT 217 12/11/2018   Lab Results  Component Value Date   NA 136 12/11/2018   K 3.7 12/11/2018   CO2 26 12/11/2018   GLUCOSE 105 (H) 12/11/2018   BUN 15 12/11/2018   CREATININE 0.80 12/11/2018   BILITOT 0.9 12/11/2018    ALKPHOS 68 12/11/2018   AST 21 12/11/2018   ALT 15 12/11/2018   PROT 7.7 12/11/2018  ALBUMIN 4.4 12/11/2018   CALCIUM 9.3 12/11/2018   ANIONGAP 9 12/11/2018   GFR 90.66 02/19/2014   Lab Results  Component Value Date   CHOL 196 08/13/2013   Lab Results  Component Value Date   HDL 61.30 08/13/2013   Lab Results  Component Value Date   LDLCALC 127 (H) 08/13/2013   Lab Results  Component Value Date   TRIG 40.0 08/13/2013   Lab Results  Component Value Date   CHOLHDL 3 08/13/2013   No results found for: HGBA1C     Assessment & Plan:   Problem List Items Addressed This Visit   None Visit Diagnoses     Otalgia of both ears    -  Primary   Relevant Medications   acetic acid 2 % otic solution   Need for tetanus booster       Relevant Orders   Tdap vaccine greater than or equal to 7yo IM (Completed)        Meds ordered this encounter  Medications   acetic acid 2 % otic solution    Sig: Place 4 drops into both ears 3 (three) times daily.    Dispense:  15 mL    Refill:  0    I, Dr. Roma Schanz, DO, personally preformed the services described in this documentation.  All medical record entries made by the scribe were at my direction and in my presence.  I have reviewed the chart and discharge instructions (if applicable) and agree that the record reflects my personal performance and is accurate and complete. 04/26/2021   I,Shehryar Baig,acting as a Education administrator for [employer], DO.,have documented all relevant documentation on the behalf of Ann Held, DO,as directed by  Ann Held, DO while in the presence of Ann Held, DO.   Ann Held, DO

## 2021-04-26 NOTE — Patient Instructions (Signed)
Earache, Adult An earache, or ear pain, can be caused by many things, including: An infection. Ear wax buildup. Ear pressure. Something in the ear that should not be there (foreign body). A sore throat. Tooth problems. Jaw problems. Treatment of the earache will depend on the cause. If the cause is not clear or cannot be determined, you may need to watch your symptoms until your earache goes away or until a cause is found. Follow these instructions at home: Medicines Take or apply over-the-counter and prescription medicines only as told by your health care provider. If you were prescribed an antibiotic medicine, use it as told by your health care provider. Do not stop using the antibiotic even if you start to feel better. Do not put anything in your ear other than medicine that is prescribed by your health care provider. Managing pain If directed, apply heat to the affected area as often as told by your health care provider. Use the heat source that your health care provider recommends, such as a moist heat pack or a heating pad. Place a towel between your skin and the heat source. Leave the heat on for 20-30 minutes. Remove the heat if your skin turns bright red. This is especially important if you are unable to feel pain, heat, or cold. You may have a greater risk of getting burned. If directed, put ice on the affected area as often as told by your health care provider. To do this:   Put ice in a plastic bag. Place a towel between your skin and the bag. Leave the ice on for 20 minutes, 2-3 times a day. General instructions Pay attention to any changes in your symptoms. Try resting in an upright position instead of lying down. This may help to reduce pressure in your ear and relieve pain. Chew gum if it helps to relieve your ear pain. Treat any allergies as told by your health care provider. Drink enough fluid to keep your urine pale yellow. It is up to you to get the results of any  tests that were done. Ask your health care provider, or the department that is doing the tests, when your results will be ready. Keep all follow-up visits as told by your health care provider. This is important. Contact a health care provider if: Your pain does not improve within 2 days. Your earache gets worse. You have new symptoms. You have a fever. Get help right away if you: Have a severe headache. Have a stiff neck. Have trouble swallowing. Have redness or swelling behind your ear. Have fluid or blood coming from your ear. Have hearing loss. Feel dizzy. Summary An earache, or ear pain, can be caused by many things. Treatment of the earache will depend on the cause. Follow recommendations from your health care provider to treat your ear pain. If the cause is not clear or cannot be determined, you may need to watch your symptoms until your earache goes away or until a cause is found. Keep all follow-up visits as told by your health care provider. This is important. This information is not intended to replace advice given to you by your health care provider. Make sure you discuss any questions you have with your health care provider. Document Revised: 04/26/2019 Document Reviewed: 04/26/2019 Elsevier Patient Education  2022 [employer]  

## 2021-04-27 NOTE — Telephone Encounter (Signed)
Pt called. LVM to return call and to set up CPE

## 2021-07-11 ENCOUNTER — Other Ambulatory Visit: Payer: Self-pay

## 2021-07-11 ENCOUNTER — Encounter: Payer: Self-pay | Admitting: Family Medicine

## 2021-07-11 ENCOUNTER — Ambulatory Visit (INDEPENDENT_AMBULATORY_CARE_PROVIDER_SITE_OTHER): Payer: BC Managed Care – PPO

## 2021-07-11 DIAGNOSIS — Z23 Encounter for immunization: Secondary | ICD-10-CM

## 2021-08-11 ENCOUNTER — Ambulatory Visit (INDEPENDENT_AMBULATORY_CARE_PROVIDER_SITE_OTHER): Payer: BC Managed Care – PPO

## 2021-08-11 ENCOUNTER — Other Ambulatory Visit: Payer: Self-pay

## 2021-08-11 DIAGNOSIS — Z23 Encounter for immunization: Secondary | ICD-10-CM | POA: Diagnosis not present

## 2021-10-18 DIAGNOSIS — Z30432 Encounter for removal of intrauterine contraceptive device: Secondary | ICD-10-CM | POA: Diagnosis not present

## 2021-10-18 DIAGNOSIS — Z1231 Encounter for screening mammogram for malignant neoplasm of breast: Secondary | ICD-10-CM | POA: Diagnosis not present

## 2021-10-18 DIAGNOSIS — Z130 Encounter for screening for diseases of the blood and blood-forming organs and certain disorders involving the immune mechanism: Secondary | ICD-10-CM | POA: Diagnosis not present

## 2021-10-18 DIAGNOSIS — Z01419 Encounter for gynecological examination (general) (routine) without abnormal findings: Secondary | ICD-10-CM | POA: Diagnosis not present

## 2021-10-18 DIAGNOSIS — Z780 Asymptomatic menopausal state: Secondary | ICD-10-CM | POA: Diagnosis not present

## 2021-11-15 ENCOUNTER — Ambulatory Visit (INDEPENDENT_AMBULATORY_CARE_PROVIDER_SITE_OTHER): Payer: BC Managed Care – PPO

## 2021-11-15 DIAGNOSIS — Z23 Encounter for immunization: Secondary | ICD-10-CM

## 2021-11-15 NOTE — Progress Notes (Signed)
51 yo female is here today for 2nd shingles vaccine Pt was given 0.5 ml of shingrix in right deltoid. Pt tolerated well

## 2022-05-12 ENCOUNTER — Ambulatory Visit (INDEPENDENT_AMBULATORY_CARE_PROVIDER_SITE_OTHER): Payer: BC Managed Care – PPO | Admitting: Family Medicine

## 2022-05-12 ENCOUNTER — Encounter: Payer: Self-pay | Admitting: Family Medicine

## 2022-05-12 VITALS — BP 140/82 | HR 77 | Temp 97.8°F | Resp 18 | Ht 64.0 in | Wt 131.2 lb

## 2022-05-12 DIAGNOSIS — Z0000 Encounter for general adult medical examination without abnormal findings: Secondary | ICD-10-CM

## 2022-05-12 NOTE — Progress Notes (Unsigned)
Subjective:     Marilyn Green is a 51 y.o. female and is here for a comprehensive physical exam. The patient reports {problems:16946}.  Social History   Socioeconomic History   Marital status: Single    Spouse name: Not on file   Number of children: Not on file   Years of education: Not on file   Highest education level: Not on file  Occupational History   Not on file  Tobacco Use   Smoking status: Never   Smokeless tobacco: Never  Vaping Use   Vaping Use: Never used  Substance and Sexual Activity   Alcohol use: Not Currently   Drug use: No   Sexual activity: Not Currently  Other Topics Concern   Not on file  Social History Narrative   Exercise- no   Social Determinants of Health   Financial Resource Strain: Not on file  Food Insecurity: Not on file  Transportation Needs: Not on file  Physical Activity: Not on file  Stress: Not on file  Social Connections: Not on file  Intimate Partner Violence: Not on file   Health Maintenance  Topic Date Due   HIV Screening  Never done   Hepatitis C Screening  Never done   MAMMOGRAM  07/15/2014   PAP SMEAR-Modifier  06/17/2016   COVID-19 Vaccine (3 - Pfizer series) 06/08/2020   INFLUENZA VACCINE  05/02/2022   COLONOSCOPY (Pts 45-72yr Insurance coverage will need to be confirmed)  12/23/2023   TETANUS/TDAP  04/27/2031   Zoster Vaccines- Shingrix  Completed   HPV VACCINES  Aged Out    {Common ambulatory SmartLinks:19316}  Review of Systems {ros; complete:30496}   Objective:    {Exam, Complete:[phone]}    Assessment:    Healthy female exam. ***     Plan:     See After Visit Summary for Counseling Recommendations

## 2022-05-12 NOTE — Patient Instructions (Addendum)
Preventive Care 40-53 Years Old, Female Preventive care refers to lifestyle choices and visits with your health care provider that can promote health and wellness. Preventive care visits are also called wellness exams. What can I expect for my preventive care visit? Counseling Your health care provider may ask you questions about your: Medical history, including: Past medical problems. Family medical history. Pregnancy history. Current health, including: Menstrual cycle. Method of birth control. Emotional well-being. Home life and relationship well-being. Sexual activity and sexual health. Lifestyle, including: Alcohol, nicotine or tobacco, and drug use. Access to firearms. Diet, exercise, and sleep habits. Work and work environment. Sunscreen use. Safety issues such as seatbelt and bike helmet use. Physical exam Your health care provider will check your: Height and weight. These may be used to calculate your BMI (body mass index). BMI is a measurement that tells if you are at a healthy weight. Waist circumference. This measures the distance around your waistline. This measurement also tells if you are at a healthy weight and may help predict your risk of certain diseases, such as type 2 diabetes and high blood pressure. Heart rate and blood pressure. Body temperature. Skin for abnormal spots. What immunizations do I need?  Vaccines are usually given at various ages, according to a schedule. Your health care provider will recommend vaccines for you based on your age, medical history, and lifestyle or other factors, such as travel or where you work. What tests do I need? Screening Your health care provider may recommend screening tests for certain conditions. This may include: Lipid and cholesterol levels. Diabetes screening. This is done by checking your blood sugar (glucose) after you have not eaten for a while (fasting). Pelvic exam and Pap test. Hepatitis B test. Hepatitis C  test. HIV (human immunodeficiency virus) test. STI (sexually transmitted infection) testing, if you are at risk. Lung cancer screening. Colorectal cancer screening. Mammogram. Talk with your health care provider about when you should start having regular mammograms. This may depend on whether you have a family history of breast cancer. BRCA-related cancer screening. This may be done if you have a family history of breast, ovarian, tubal, or peritoneal cancers. Bone density scan. This is done to screen for osteoporosis. Talk with your health care provider about your test results, treatment options, and if necessary, the need for more tests. Follow these instructions at home: Eating and drinking  Eat a diet that includes fresh fruits and vegetables, whole grains, lean protein, and low-fat dairy products. Take vitamin and mineral supplements as recommended by your health care provider. Do not drink alcohol if: Your health care provider tells you not to drink. You are pregnant, may be pregnant, or are planning to become pregnant. If you drink alcohol: Limit how much you have to 0-1 drink a day. Know how much alcohol is in your drink. In the [place] one drink equals one 12 oz bottle of beer (355 mL), one 5 oz glass of wine (148 mL), or one 1 oz glass of hard liquor (44 mL). Lifestyle Brush your teeth every morning and night with fluoride toothpaste. Floss one time each day. Exercise for at least 30 minutes 5 or more days each week. Do not use any products that contain nicotine or tobacco. These products include cigarettes, chewing tobacco, and vaping devices, such as e-cigarettes. If you need help quitting, ask your health care provider. Do not use drugs. If you are sexually active, practice safe sex. Use a condom or other form of protection to   prevent STIs. If you do not wish to become pregnant, use a form of birth control. If you plan to become pregnant, see your health care provider for a  prepregnancy visit. Take aspirin only as told by your health care provider. Make sure that you understand how much to take and what form to take. Work with your health care provider to find out whether it is safe and beneficial for you to take aspirin daily. Find healthy ways to manage stress, such as: Meditation, yoga, or listening to music. Journaling. Talking to a trusted person. Spending time with friends and family. Minimize exposure to UV radiation to reduce your risk of skin cancer. Safety Always wear your seat belt while driving or riding in a vehicle. Do not drive: If you have been drinking alcohol. Do not ride with someone who has been drinking. When you are tired or distracted. While texting. If you have been using any mind-altering substances or drugs. Wear a helmet and other protective equipment during sports activities. If you have firearms in your house, make sure you follow all gun safety procedures. Seek help if you have been physically or sexually abused. What's next? Visit your health care provider once a year for an annual wellness visit. Ask your health care provider how often you should have your eyes and teeth checked. Stay up to date on all vaccines. This information is not intended to replace advice given to you by your health care provider. Make sure you discuss any questions you have with your health care provider. Document Revised: 03/16/2021 Document Reviewed: 03/16/2021 Elsevier Patient Education  [place] Eating Plan DASH stands for Dietary Approaches to Stop Hypertension. The DASH eating plan is a healthy eating plan that has been shown to: Reduce high blood pressure (hypertension). Reduce your risk for type 2 diabetes, heart disease, and stroke. Help with weight loss. What are tips for following this plan? Reading food labels Check food labels for the amount of salt (sodium) per serving. Choose foods with less than 5 percent of the  Daily Value of sodium. Generally, foods with less than 300 milligrams (mg) of sodium per serving fit into this eating plan. To find whole grains, look for the word "whole" as the first word in the ingredient list. Shopping Buy products labeled as "low-sodium" or "no salt added." Buy fresh foods. Avoid canned foods and pre-made or frozen meals. Cooking Avoid adding salt when cooking. Use salt-free seasonings or herbs instead of table salt or sea salt. Check with your health care provider or pharmacist before using salt substitutes. Do not fry foods. Cook foods using healthy methods such as baking, boiling, grilling, roasting, and broiling instead. Cook with heart-healthy oils, such as olive, canola, avocado, soybean, or sunflower oil. Meal planning  Eat a balanced diet that includes: 4 or more servings of fruits and 4 or more servings of vegetables each day. Try to fill one-half of your plate with fruits and vegetables. 6-8 servings of whole grains each day. Less than 6 oz (170 g) of lean meat, poultry, or fish each day. A 3-oz (85-g) serving of meat is about the same size as a deck of cards. One egg equals 1 oz (28 g). 2-3 servings of low-fat dairy each day. One serving is 1 cup (237 mL). 1 serving of nuts, seeds, or beans 5 times each week. 2-3 servings of heart-healthy fats. Healthy fats called omega-3 fatty acids are found in foods such as walnuts, flaxseeds, fortified milks, and  eggs. These fats are also found in cold-water fish, such as sardines, salmon, and mackerel. Limit how much you eat of: Canned or prepackaged foods. Food that is high in trans fat, such as some fried foods. Food that is high in saturated fat, such as fatty meat. Desserts and other sweets, sugary drinks, and other foods with added sugar. Full-fat dairy products. Do not salt foods before eating. Do not eat more than 4 egg yolks a week. Try to eat at least 2 vegetarian meals a week. Eat more home-cooked food and  less restaurant, buffet, and fast food. Lifestyle When eating at a restaurant, ask that your food be prepared with less salt or no salt, if possible. If you drink alcohol: Limit how much you use to: 0-1 drink a day for women who are not pregnant. 0-2 drinks a day for men. Be aware of how much alcohol is in your drink. In the [place] one drink equals one 12 oz bottle of beer (355 mL), one 5 oz glass of wine (148 mL), or one 1 oz glass of hard liquor (44 mL). General information Avoid eating more than 2,300 mg of salt a day. If you have hypertension, you may need to reduce your sodium intake to 1,500 mg a day. Work with your health care provider to maintain a healthy body weight or to lose weight. Ask what an ideal weight is for you. Get at least 30 minutes of exercise that causes your heart to beat faster (aerobic exercise) most days of the week. Activities may include walking, swimming, or biking. Work with your health care provider or dietitian to adjust your eating plan to your individual calorie needs. What foods should I eat? Fruits All fresh, dried, or frozen fruit. Canned fruit in natural juice (without added sugar). Vegetables Fresh or frozen vegetables (raw, steamed, roasted, or grilled). Low-sodium or reduced-sodium tomato and vegetable juice. Low-sodium or reduced-sodium tomato sauce and tomato paste. Low-sodium or reduced-sodium canned vegetables. Grains Whole-grain or whole-wheat bread. Whole-grain or whole-wheat pasta. Brown rice. [place]. Bulgur. Whole-grain and low-sodium cereals. Pita bread. Low-fat, low-sodium crackers. Whole-wheat flour tortillas. Meats and other proteins Skinless chicken or [place]. Ground chicken or [place]. Pork with fat trimmed off. Fish and seafood. Egg whites. Dried beans, peas, or lentils. Unsalted nuts, nut butters, and seeds. Unsalted canned beans. Lean cuts of beef with fat trimmed off. Low-sodium, lean precooked or cured meat, such as sausages  or meat loaves. Dairy Low-fat (1%) or fat-free (skim) milk. Reduced-fat, low-fat, or fat-free cheeses. Nonfat, low-sodium ricotta or cottage cheese. Low-fat or nonfat yogurt. Low-fat, low-sodium cheese. Fats and oils Soft margarine without trans fats. Vegetable oil. Reduced-fat, low-fat, or light mayonnaise and salad dressings (reduced-sodium). Canola, safflower, olive, avocado, soybean, and sunflower oils. Avocado. Seasonings and condiments Herbs. Spices. Seasoning mixes without salt. Other foods Unsalted popcorn and pretzels. Fat-free sweets. The items listed above may not be a complete list of foods and beverages you can eat. Contact a dietitian for more information. What foods should I avoid? Fruits Canned fruit in a light or heavy syrup. Fried fruit. Fruit in cream or butter sauce. Vegetables Creamed or fried vegetables. Vegetables in a cheese sauce. Regular canned vegetables (not low-sodium or reduced-sodium). Regular canned tomato sauce and paste (not low-sodium or reduced-sodium). Regular tomato and vegetable juice (not low-sodium or reduced-sodium). Angie Fava. Olives. Grains Baked goods made with fat, such as croissants, muffins, or some breads. Dry pasta or rice meal packs. Meats and other proteins Fatty cuts of  meat. Ribs. Fried meat. Berniece Salines. Bologna, salami, and other precooked or cured meats, such as sausages or meat loaves. Fat from the back of a pig (fatback). Bratwurst. Salted nuts and seeds. Canned beans with added salt. Canned or smoked fish. Whole eggs or egg yolks. Chicken or [place] with skin. Dairy Whole or 2% milk, cream, and half-and-half. Whole or full-fat cream cheese. Whole-fat or sweetened yogurt. Full-fat cheese. Nondairy creamers. Whipped toppings. Processed cheese and cheese spreads. Fats and oils Butter. Stick margarine. Lard. Shortening. Ghee. Bacon fat. Tropical oils, such as coconut, palm kernel, or palm oil. Seasonings and condiments Onion salt, garlic salt,  seasoned salt, table salt, and sea salt. Worcestershire sauce. Tartar sauce. Barbecue sauce. Teriyaki sauce. Soy sauce, including reduced-sodium. Steak sauce. Canned and packaged gravies. Fish sauce. Oyster sauce. Cocktail sauce. Store-bought horseradish. Ketchup. Mustard. Meat flavorings and tenderizers. Bouillon cubes. Hot sauces. Pre-made or packaged marinades. Pre-made or packaged taco seasonings. Relishes. Regular salad dressings. Other foods Salted popcorn and pretzels. The items listed above may not be a complete list of foods and beverages you should avoid. Contact a dietitian for more information. Where to find more information National Heart, Lung, and Blood Institute: https://wilson-eaton.com/ American Heart Association: www.heart.org Academy of Nutrition and Dietetics: www.eatright.[place]: www.kidney.org Summary The DASH eating plan is a healthy eating plan that has been shown to reduce high blood pressure (hypertension). It may also reduce your risk for type 2 diabetes, heart disease, and stroke. When on the DASH eating plan, aim to eat more fresh fruits and vegetables, whole grains, lean proteins, low-fat dairy, and heart-healthy fats. With the DASH eating plan, you should limit salt (sodium) intake to 2,300 mg a day. If you have hypertension, you may need to reduce your sodium intake to 1,500 mg a day. Work with your health care provider or dietitian to adjust your eating plan to your individual calorie needs. This information is not intended to replace advice given to you by your health care provider. Make sure you discuss any questions you have with your health care provider. Document Revised: 08/22/2019 Document Reviewed: 08/22/2019 Elsevier Patient Education  [place].

## 2022-05-15 ENCOUNTER — Telehealth: Payer: Self-pay | Admitting: *Deleted

## 2022-05-15 DIAGNOSIS — Z0000 Encounter for general adult medical examination without abnormal findings: Secondary | ICD-10-CM

## 2022-05-15 LAB — COMPREHENSIVE METABOLIC PANEL
AG Ratio: 1.5 (calc) (ref 1.0–2.5)
ALT: 14 U/L (ref 6–29)
AST: 16 U/L (ref 10–35)
Albumin: 4.4 g/dL (ref 3.6–5.1)
Alkaline phosphatase (APISO): 64 U/L (ref 37–153)
BUN: 14 mg/dL (ref 7–25)
CO2: 28 mmol/L (ref 20–32)
Calcium: 9.7 mg/dL (ref 8.6–10.4)
Chloride: 102 mmol/L (ref 98–110)
Creat: 0.94 mg/dL (ref 0.50–1.03)
Globulin: 3 g/dL (calc) (ref 1.9–3.7)
Glucose, Bld: 83 mg/dL (ref 65–99)
Potassium: 5 mmol/L (ref 3.5–5.3)
Sodium: 140 mmol/L (ref 135–146)
Total Bilirubin: 0.7 mg/dL (ref 0.2–1.2)
Total Protein: 7.4 g/dL (ref 6.1–8.1)

## 2022-05-15 LAB — HEPATITIS C ANTIBODY: Hepatitis C Ab: NONREACTIVE

## 2022-05-15 LAB — TSH: TSH: 1.72 mIU/L

## 2022-05-15 LAB — LIPID PANEL
Cholesterol: 237 mg/dL — ABNORMAL HIGH (ref <200)
HDL: 84 mg/dL (ref >=50)
LDL Cholesterol (Calc): 140 mg/dL (calc) — ABNORMAL HIGH
Non-HDL Cholesterol (Calc): 153 mg/dL (calc) — ABNORMAL HIGH (ref <130)
Total CHOL/HDL Ratio: 2.8 (calc) (ref <5.0)
Triglycerides: 44 mg/dL (ref <150)

## 2022-05-15 LAB — UNLABELED: Test Ordered On Req: 6399

## 2022-05-15 NOTE — Telephone Encounter (Signed)
Received call from [facility] stating they received specimens on pt from 05/12/22 and the lavender tube with the requisition was not labelled. Advised [facility] to discard unlabelled specimen and we would contact pt to re-collect. Notified pt lab was unable to use previous specimen and we would need to redraw it.  Pt has follow up on 8/25 and I advised her we could draw it then unless she preferred to come in for lab only.  Pt ok to wait until 8/25 and will come to the lab after that visit for recollection of CBC. Future lab order placed.

## 2022-05-26 ENCOUNTER — Encounter: Payer: Self-pay | Admitting: Family Medicine

## 2022-05-26 ENCOUNTER — Ambulatory Visit: Payer: BC Managed Care – PPO | Admitting: Family Medicine

## 2022-05-26 VITALS — BP 118/68 | HR 96 | Temp 98.7°F | Resp 16 | Ht 64.0 in | Wt 127.6 lb

## 2022-05-26 DIAGNOSIS — Z0000 Encounter for general adult medical examination without abnormal findings: Secondary | ICD-10-CM | POA: Diagnosis not present

## 2022-05-26 DIAGNOSIS — R030 Elevated blood-pressure reading, without diagnosis of hypertension: Secondary | ICD-10-CM

## 2022-05-26 DIAGNOSIS — E785 Hyperlipidemia, unspecified: Secondary | ICD-10-CM

## 2022-05-26 LAB — CBC WITH DIFFERENTIAL/PLATELET
Basophils Absolute: 0 10*3/uL (ref 0.0–0.1)
Basophils Relative: 1 % (ref 0.0–3.0)
Eosinophils Absolute: 0.1 10*3/uL (ref 0.0–0.7)
Eosinophils Relative: 3.5 % (ref 0.0–5.0)
HCT: 41.4 % (ref 36.0–46.0)
Hemoglobin: 13.5 g/dL (ref 12.0–15.0)
Lymphocytes Relative: 41.3 % (ref 12.0–46.0)
Lymphs Abs: 1.3 10*3/uL (ref 0.7–4.0)
MCHC: 32.7 g/dL (ref 30.0–36.0)
MCV: 85.5 fl (ref 78.0–100.0)
Monocytes Absolute: 0.3 10*3/uL (ref 0.1–1.0)
Monocytes Relative: 9.7 % (ref 3.0–12.0)
Neutro Abs: 1.4 10*3/uL (ref 1.4–7.7)
Neutrophils Relative %: 44.5 % (ref 43.0–77.0)
Platelets: 189 10*3/uL (ref 150.0–400.0)
RBC: 4.84 Mil/uL (ref 3.87–5.11)
RDW: 13.2 % (ref 11.5–15.5)
WBC: 3.2 10*3/uL — ABNORMAL LOW (ref 4.0–10.5)

## 2022-05-26 NOTE — Progress Notes (Signed)
Established Patient Office Visit  Subjective   Patient ID: Marilyn Green, female    DOB: June 02, 1971  Age: 51 y.o. 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 No pertinent surgical history. Social History   Tobacco Use   Smoking status: Never   Smokeless tobacco: Never  Vaping Use   Vaping Use: Never used  Substance Use Topics   Alcohol use: Not Currently   Drug use: No   Social History   Socioeconomic History   Marital status: Single    Spouse name: Not on file   Number of children: Not on file   Years of education: Not on file   Highest education level: Not on file  Occupational History   Not on file  Tobacco Use   Smoking status: Never   Smokeless tobacco: Never  Vaping Use   Vaping Use: Never used  Substance and Sexual Activity   Alcohol use: Not Currently   Drug use: No   Sexual activity: Not Currently    Partners: Male  Other Topics Concern   Not on file  Social History Narrative   Exercise- no   Social Determinants of Health   Financial Resource Strain: Not on file  Food Insecurity: Not on file  Transportation Needs: Not on file  Physical Activity: Not on file  Stress: Not on file  Social Connections: Not on file  Intimate Partner Violence: Not on file   Family Status  Relation Name Status   Mother  Alive   Father  Deceased   Sister  Alive   Sister  Alive   Sister  Alive   Brother  [place]   Brother  Alive   MGM  Deceased   MGF  Deceased   PGM  Deceased   PGF  Deceased   Neg Hx  (Not Specified)   Family History   Problem Relation Age of Onset   Hyperlipidemia Mother    Hypertension Mother    Colon cancer Neg Hx    Esophageal cancer Neg Hx    Rectal cancer Neg Hx    Stomach cancer Neg Hx    No Known Allergies    Review of Systems  Constitutional:  Negative for fever and malaise/fatigue.  HENT:  Negative for congestion.   Eyes:  Negative for blurred vision.  Respiratory:  Negative for cough and shortness of breath.   Cardiovascular:  Negative for chest pain, palpitations and leg swelling.  Gastrointestinal:  Negative for vomiting.  Musculoskeletal:  Negative for back pain.  Skin:  Negative for rash.  Neurological:  Negative for loss of consciousness and headaches.      Objective:     BP 118/68 (BP Location: Left Arm, Patient Position: Sitting, Cuff Size: Normal)   Pulse 96   Temp 98.7 F (37.1 C) (Oral)   Resp 16   Ht '5\' 4"'$  (1.626 m)   Wt 127 lb 9.6 oz (57.9 kg)   LMP 01/25/2014   SpO2 100%   BMI 21.90 kg/m  BP Readings  from Last 3 Encounters:  05/26/22 118/68  05/12/22 (!) 140/82  04/26/21 140/80   Wt Readings from Last 3 Encounters:  05/26/22 127 lb 9.6 oz (57.9 kg)  05/12/22 131 lb 3.2 oz (59.5 kg)  04/26/21 131 lb 6.4 oz (59.6 kg)   SpO2 Readings from Last 3 Encounters:  05/26/22 100%  05/12/22 100%  04/26/21 99%      Physical Exam Vitals and nursing note reviewed.  Constitutional:      Appearance: She is well-developed.  HENT:     Head: Normocephalic and atraumatic.  Eyes:     Conjunctiva/sclera: Conjunctivae normal.  Neck:     Thyroid: No thyromegaly.     Vascular: No carotid bruit or JVD.  Cardiovascular:     Rate and Rhythm: Normal rate and regular rhythm.     Heart sounds: Normal heart sounds. No murmur heard. Pulmonary:     Effort: Pulmonary effort is normal. No respiratory distress.     Breath sounds: Normal breath sounds. No wheezing or rales.  Chest:     Chest wall: No tenderness.  Musculoskeletal:     Cervical back: Normal range of  motion and neck supple.  Neurological:     Mental Status: She is alert and oriented to person, place, and time.     Results for orders placed or performed in visit on 05/26/22  CBC with Differential/Platelet  Result Value Ref Range   WBC 3.2 (L) 4.0 - 10.5 K/uL   RBC 4.84 3.87 - 5.11 Mil/uL   Hemoglobin 13.5 12.0 - 15.0 g/dL   HCT 41.4 36.0 - 46.0 %   MCV 85.5 78.0 - 100.0 fl   MCHC 32.7 30.0 - 36.0 g/dL   RDW 13.2 11.5 - 15.5 %   Platelets 189.0 150.0 - 400.0 K/uL   Neutrophils Relative % 44.5 43.0 - 77.0 %   Lymphocytes Relative 41.3 12.0 - 46.0 %   Monocytes Relative 9.7 3.0 - 12.0 %   Eosinophils Relative 3.5 0.0 - 5.0 %   Basophils Relative 1.0 0.0 - 3.0 %   Neutro Abs 1.4 1.4 - 7.7 K/uL   Lymphs Abs 1.3 0.7 - 4.0 K/uL   Monocytes Absolute 0.3 0.1 - 1.0 K/uL   Eosinophils Absolute 0.1 0.0 - 0.7 K/uL   Basophils Absolute 0.0 0.0 - 0.1 K/uL    Last CBC Lab Results  Component Value Date   WBC 3.2 (L) 05/26/2022   HGB 13.5 05/26/2022   HCT 41.4 05/26/2022   MCV 85.5 05/26/2022   MCH 27.5 12/11/2018   RDW 13.2 05/26/2022   PLT 189.0 30/06/2329   Last metabolic panel Lab Results  Component Value Date   GLUCOSE 83 05/12/2022   NA 140 05/12/2022   K 5.0 05/12/2022   CL 102 05/12/2022   CO2 28 05/12/2022   BUN 14 05/12/2022   CREATININE 0.94 05/12/2022   GFRNONAA >60 12/11/2018   CALCIUM 9.7 05/12/2022   PROT 7.4 05/12/2022   ALBUMIN 4.4 12/11/2018   BILITOT 0.7 05/12/2022   ALKPHOS 68 12/11/2018   AST 16 05/12/2022   ALT 14 05/12/2022   ANIONGAP 9 12/11/2018   Last lipids Lab Results  Component Value Date   CHOL 237 (H) 05/12/2022   HDL 84 05/12/2022   LDLCALC 140 (H) 05/12/2022   TRIG 44 05/12/2022   CHOLHDL 2.8 05/12/2022   Last hemoglobin A1c No results found for: "HGBA1C" Last thyroid functions Lab Results  Component Value Date   TSH 1.72 05/12/2022  Last vitamin D No results found for: "25OHVITD2", "25OHVITD3", "VD25OH" Last vitamin B12  and Folate No results found for: "VITAMINB12", "FOLATE"    The 10-year ASCVD risk score (Arnett DK, et al., 2019) is: 1.2%    Assessment & Plan:   Problem List Items Addressed This Visit       Unprioritized   Elevated BP without diagnosis of hypertension - Primary    Normal today Dash diet  rto for labs in 3 months and f/u in 6 months       Other Visit Diagnoses     Hyperlipidemia, unspecified hyperlipidemia type       Relevant Orders   Comprehensive metabolic panel   Lipid panel   Preventative health care           Return in about 6 months (around 11/26/2022), or if symptoms worsen or fail to improve--lab visit in 3 months.    Ann Held, DO

## 2022-05-26 NOTE — Patient Instructions (Addendum)
Cholesterol Content in Foods Cholesterol is a waxy, fat-like substance that helps to carry fat in the blood. The body needs cholesterol in small amounts, but too much cholesterol can cause damage to the arteries and heart. What foods have cholesterol?  Cholesterol is found in animal-based foods, such as meat, seafood, and dairy. Generally, low-fat dairy and lean meats have less cholesterol than full-fat dairy and fatty meats. The milligrams of cholesterol per serving (mg per serving) of common cholesterol-containing foods are listed below. Meats and other proteins Egg -- one large whole egg has 186 mg. Veal shank -- 4 oz (113 g) has 141 mg. Lean ground turkey (93% lean) -- 4 oz (113 g) has 118 mg. Fat-trimmed lamb loin -- 4 oz (113 g) has 106 mg. Lean ground beef (90% lean) -- 4 oz (113 g) has 100 mg. Lobster -- 3.5 oz (99 g) has 90 mg. Pork loin chops -- 4 oz (113 g) has 86 mg. Canned salmon -- 3.5 oz (99 g) has 83 mg. Fat-trimmed beef top loin -- 4 oz (113 g) has 78 mg. Frankfurter -- 1 frank (3.5 oz or 99 g) has 77 mg. Crab -- 3.5 oz (99 g) has 71 mg. Roasted chicken without skin, white meat -- 4 oz (113 g) has 66 mg. Light bologna -- 2 oz (57 g) has 45 mg. Deli-cut turkey -- 2 oz (57 g) has 31 mg. Canned tuna -- 3.5 oz (99 g) has 31 mg. Bacon -- 1 oz (28 g) has 29 mg. Oysters and mussels (raw) -- 3.5 oz (99 g) has 25 mg. Mackerel -- 1 oz (28 g) has 22 mg. Trout -- 1 oz (28 g) has 20 mg. Pork sausage -- 1 link (1 oz or 28 g) has 17 mg. Salmon -- 1 oz (28 g) has 16 mg. Tilapia -- 1 oz (28 g) has 14 mg. Dairy Soft-serve ice cream --  cup (4 oz or 86 g) has 103 mg. Whole-milk yogurt -- 1 cup (8 oz or 245 g) has 29 mg. Cheddar cheese -- 1 oz (28 g) has 28 mg. American cheese -- 1 oz (28 g) has 28 mg. Whole milk -- 1 cup (8 oz or 250 mL) has 23 mg. 2% milk -- 1 cup (8 oz or 250 mL) has 18 mg. Cream cheese -- 1 tablespoon (Tbsp) (14.5 g) has 15 mg. Cottage cheese --  cup (4 oz or  113 g) has 14 mg. Low-fat (1%) milk -- 1 cup (8 oz or 250 mL) has 10 mg. Sour cream -- 1 Tbsp (12 g) has 8.5 mg. Low-fat yogurt -- 1 cup (8 oz or 245 g) has 8 mg. Nonfat Greek yogurt -- 1 cup (8 oz or 228 g) has 7 mg. Half-and-half cream -- 1 Tbsp (15 mL) has 5 mg. Fats and oils Cod liver oil -- 1 tablespoon (Tbsp) (13.6 g) has 82 mg. Butter -- 1 Tbsp (14 g) has 15 mg. Lard -- 1 Tbsp (12.8 g) has 14 mg. Bacon grease -- 1 Tbsp (12.9 g) has 14 mg. Mayonnaise -- 1 Tbsp (13.8 g) has 5-10 mg. Margarine -- 1 Tbsp (14 g) has 3-10 mg. The items listed above may not be a complete list of foods with cholesterol. Exact amounts of cholesterol in these foods may vary depending on specific ingredients and brands. Contact a dietitian for more information. What foods do not have cholesterol? Most plant-based foods do not have cholesterol unless you combine them with a food that has   cholesterol. Foods without cholesterol include: Grains and cereals. Vegetables. Fruits. Vegetable oils, such as olive, canola, and sunflower oil. Legumes, such as peas, beans, and lentils. Nuts and seeds. Egg whites. The items listed above may not be a complete list of foods that do not have cholesterol. Contact a dietitian for more information. Summary The body needs cholesterol in small amounts, but too much cholesterol can cause damage to the arteries and heart. Cholesterol is found in animal-based foods, such as meat, seafood, and dairy. Generally, low-fat dairy and lean meats have less cholesterol than full-fat dairy and fatty meats. This information is not intended to replace advice given to you by your health care provider. Make sure you discuss any questions you have with your health care provider. Document Revised: 01/28/2021 Document Reviewed: 01/28/2021 Elsevier Patient Education  [place] Eating Plan DASH stands for Dietary Approaches to Stop Hypertension. The DASH eating plan is a healthy eating  plan that has been shown to: Reduce high blood pressure (hypertension). Reduce your risk for type 2 diabetes, heart disease, and stroke. Help with weight loss. What are tips for following this plan? Reading food labels Check food labels for the amount of salt (sodium) per serving. Choose foods with less than 5 percent of the Daily Value of sodium. Generally, foods with less than 300 milligrams (mg) of sodium per serving fit into this eating plan. To find whole grains, look for the word "whole" as the first word in the ingredient list. Shopping Buy products labeled as "low-sodium" or "no salt added." Buy fresh foods. Avoid canned foods and pre-made or frozen meals. Cooking Avoid adding salt when cooking. Use salt-free seasonings or herbs instead of table salt or sea salt. Check with your health care provider or pharmacist before using salt substitutes. Do not fry foods. Cook foods using healthy methods such as baking, boiling, grilling, roasting, and broiling instead. Cook with heart-healthy oils, such as olive, canola, avocado, soybean, or sunflower oil. Meal planning  Eat a balanced diet that includes: 4 or more servings of fruits and 4 or more servings of vegetables each day. Try to fill one-half of your plate with fruits and vegetables. 6-8 servings of whole grains each day. Less than 6 oz (170 g) of lean meat, poultry, or fish each day. A 3-oz (85-g) serving of meat is about the same size as a deck of cards. One egg equals 1 oz (28 g). 2-3 servings of low-fat dairy each day. One serving is 1 cup (237 mL). 1 serving of nuts, seeds, or beans 5 times each week. 2-3 servings of heart-healthy fats. Healthy fats called omega-3 fatty acids are found in foods such as walnuts, flaxseeds, fortified milks, and eggs. These fats are also found in cold-water fish, such as sardines, salmon, and mackerel. Limit how much you eat of: Canned or prepackaged foods. Food that is high in trans fat, such as  some fried foods. Food that is high in saturated fat, such as fatty meat. Desserts and other sweets, sugary drinks, and other foods with added sugar. Full-fat dairy products. Do not salt foods before eating. Do not eat more than 4 egg yolks a week. Try to eat at least 2 vegetarian meals a week. Eat more home-cooked food and less restaurant, buffet, and fast food. Lifestyle When eating at a restaurant, ask that your food be prepared with less salt or no salt, if possible. If you drink alcohol: Limit how much you use to: 0-1 drink a day  for women who are not pregnant. 0-2 drinks a day for men. Be aware of how much alcohol is in your drink. In the [place] one drink equals one 12 oz bottle of beer (355 mL), one 5 oz glass of wine (148 mL), or one 1 oz glass of hard liquor (44 mL). General information Avoid eating more than 2,300 mg of salt a day. If you have hypertension, you may need to reduce your sodium intake to 1,500 mg a day. Work with your health care provider to maintain a healthy body weight or to lose weight. Ask what an ideal weight is for you. Get at least 30 minutes of exercise that causes your heart to beat faster (aerobic exercise) most days of the week. Activities may include walking, swimming, or biking. Work with your health care provider or dietitian to adjust your eating plan to your individual calorie needs. What foods should I eat? Fruits All fresh, dried, or frozen fruit. Canned fruit in natural juice (without added sugar). Vegetables Fresh or frozen vegetables (raw, steamed, roasted, or grilled). Low-sodium or reduced-sodium tomato and vegetable juice. Low-sodium or reduced-sodium tomato sauce and tomato paste. Low-sodium or reduced-sodium canned vegetables. Grains Whole-grain or whole-wheat bread. Whole-grain or whole-wheat pasta. Brown rice. [place]. Bulgur. Whole-grain and low-sodium cereals. Pita bread. Low-fat, low-sodium crackers. Whole-wheat flour  tortillas. Meats and other proteins Skinless chicken or [place]. Ground chicken or [place]. Pork with fat trimmed off. Fish and seafood. Egg whites. Dried beans, peas, or lentils. Unsalted nuts, nut butters, and seeds. Unsalted canned beans. Lean cuts of beef with fat trimmed off. Low-sodium, lean precooked or cured meat, such as sausages or meat loaves. Dairy Low-fat (1%) or fat-free (skim) milk. Reduced-fat, low-fat, or fat-free cheeses. Nonfat, low-sodium ricotta or cottage cheese. Low-fat or nonfat yogurt. Low-fat, low-sodium cheese. Fats and oils Soft margarine without trans fats. Vegetable oil. Reduced-fat, low-fat, or light mayonnaise and salad dressings (reduced-sodium). Canola, safflower, olive, avocado, soybean, and sunflower oils. Avocado. Seasonings and condiments Herbs. Spices. Seasoning mixes without salt. Other foods Unsalted popcorn and pretzels. Fat-free sweets. The items listed above may not be a complete list of foods and beverages you can eat. Contact a dietitian for more information. What foods should I avoid? Fruits Canned fruit in a light or heavy syrup. Fried fruit. Fruit in cream or butter sauce. Vegetables Creamed or fried vegetables. Vegetables in a cheese sauce. Regular canned vegetables (not low-sodium or reduced-sodium). Regular canned tomato sauce and paste (not low-sodium or reduced-sodium). Regular tomato and vegetable juice (not low-sodium or reduced-sodium). Angie Fava. Olives. Grains Baked goods made with fat, such as croissants, muffins, or some breads. Dry pasta or rice meal packs. Meats and other proteins Fatty cuts of meat. Ribs. Fried meat. Berniece Salines. Bologna, salami, and other precooked or cured meats, such as sausages or meat loaves. Fat from the back of a pig (fatback). Bratwurst. Salted nuts and seeds. Canned beans with added salt. Canned or smoked fish. Whole eggs or egg yolks. Chicken or [place] with skin. Dairy Whole or 2% milk, cream, and half-and-half.  Whole or full-fat cream cheese. Whole-fat or sweetened yogurt. Full-fat cheese. Nondairy creamers. Whipped toppings. Processed cheese and cheese spreads. Fats and oils Butter. Stick margarine. Lard. Shortening. Ghee. Bacon fat. Tropical oils, such as coconut, palm kernel, or palm oil. Seasonings and condiments Onion salt, garlic salt, seasoned salt, table salt, and sea salt. Worcestershire sauce. Tartar sauce. Barbecue sauce. Teriyaki sauce. Soy sauce, including reduced-sodium. Steak sauce. Canned and packaged gravies. Fish sauce.  Oyster sauce. Cocktail sauce. Store-bought horseradish. Ketchup. Mustard. Meat flavorings and tenderizers. Bouillon cubes. Hot sauces. Pre-made or packaged marinades. Pre-made or packaged taco seasonings. Relishes. Regular salad dressings. Other foods Salted popcorn and pretzels. The items listed above may not be a complete list of foods and beverages you should avoid. Contact a dietitian for more information. Where to find more information National Heart, Lung, and Blood Institute: https://wilson-eaton.com/ American Heart Association: www.heart.org Academy of Nutrition and Dietetics: www.eatright.[place]: www.kidney.org Summary The DASH eating plan is a healthy eating plan that has been shown to reduce high blood pressure (hypertension). It may also reduce your risk for type 2 diabetes, heart disease, and stroke. When on the DASH eating plan, aim to eat more fresh fruits and vegetables, whole grains, lean proteins, low-fat dairy, and heart-healthy fats. With the DASH eating plan, you should limit salt (sodium) intake to 2,300 mg a day. If you have hypertension, you may need to reduce your sodium intake to 1,500 mg a day. Work with your health care provider or dietitian to adjust your eating plan to your individual calorie needs. This information is not intended to replace advice given to you by your health care provider. Make sure you discuss any questions  you have with your health care provider. Document Revised: 08/22/2019 Document Reviewed: 08/22/2019 Elsevier Patient Education  [place].

## 2022-05-26 NOTE — Assessment & Plan Note (Signed)
Normal today Dash diet  rto for labs in 3 months and f/u in 6 months

## 2022-08-28 ENCOUNTER — Other Ambulatory Visit: Payer: BC Managed Care – PPO

## 2022-08-31 ENCOUNTER — Other Ambulatory Visit (INDEPENDENT_AMBULATORY_CARE_PROVIDER_SITE_OTHER): Payer: BC Managed Care – PPO

## 2022-08-31 DIAGNOSIS — E785 Hyperlipidemia, unspecified: Secondary | ICD-10-CM | POA: Diagnosis not present

## 2022-08-31 LAB — COMPREHENSIVE METABOLIC PANEL
ALT: 14 U/L (ref 0–35)
AST: 16 U/L (ref 0–37)
Albumin: 4.2 g/dL (ref 3.5–5.2)
Alkaline Phosphatase: 52 U/L (ref 39–117)
BUN: 14 mg/dL (ref 6–23)
CO2: 31 mEq/L (ref 19–32)
Calcium: 9.4 mg/dL (ref 8.4–10.5)
Chloride: 105 mEq/L (ref 96–112)
Creatinine, Ser: 0.93 mg/dL (ref 0.40–1.20)
GFR: 70.3 mL/min (ref >60.00)
Glucose, Bld: 81 mg/dL (ref 70–99)
Potassium: 4.5 mEq/L (ref 3.5–5.1)
Sodium: 142 mEq/L (ref 135–145)
Total Bilirubin: 0.9 mg/dL (ref 0.2–1.2)
Total Protein: 6.8 g/dL (ref 6.0–8.3)

## 2022-08-31 LAB — LIPID PANEL
Cholesterol: 215 mg/dL — ABNORMAL HIGH (ref 0–200)
HDL: 70.9 mg/dL (ref >39.00)
LDL Cholesterol: 132 mg/dL — ABNORMAL HIGH (ref 0–99)
NonHDL: 143.6
Total CHOL/HDL Ratio: 3
Triglycerides: 57 mg/dL (ref 0.0–149.0)
VLDL: 11.4 mg/dL (ref 0.0–40.0)

## 2022-09-02 ENCOUNTER — Other Ambulatory Visit: Payer: Self-pay | Admitting: Family Medicine

## 2022-09-02 DIAGNOSIS — E785 Hyperlipidemia, unspecified: Secondary | ICD-10-CM

## 2022-09-04 ENCOUNTER — Other Ambulatory Visit: Payer: Self-pay

## 2022-09-04 ENCOUNTER — Telehealth: Payer: Self-pay | Admitting: Family Medicine

## 2022-09-04 MED ORDER — ROSUVASTATIN CALCIUM 10 MG PO TABS: 10.0000 mg | ORAL_TABLET | Freq: Every day | ORAL | 2 refills | Status: DC

## 2022-09-04 NOTE — Telephone Encounter (Signed)
Patient would like a call back to go over lab results.

## 2022-09-06 NOTE — Telephone Encounter (Signed)
Marilyn Green, [place] 09/04/2022  2:10 PM EST Back to Top    Patient notified of result, rx sent and she will follow up in 3 months as scheduled   Ann Held, DO 09/02/2022  8:03 PM EST     Cholesterol--- LDL goal < 100,  HDL >40,  TG < 150.  Diet and exercise will increase HDL and decrease LDL and TG.  Fish,  Fish Oil, Flaxseed oil will also help increase the HDL and decrease Triglycerides.   Recheck labs in 3 months We need to start Crestor 10 mg #30  2 refills

## 2022-09-08 ENCOUNTER — Ambulatory Visit (INDEPENDENT_AMBULATORY_CARE_PROVIDER_SITE_OTHER): Payer: BC Managed Care – PPO

## 2022-09-08 DIAGNOSIS — Z23 Encounter for immunization: Secondary | ICD-10-CM

## 2022-09-08 NOTE — Progress Notes (Signed)
Pt here for Influenza shot  Pt tolerated Well Left Deltoid

## 2022-09-15 ENCOUNTER — Encounter: Payer: Self-pay | Admitting: Family Medicine

## 2022-10-24 DIAGNOSIS — Z130 Encounter for screening for diseases of the blood and blood-forming organs and certain disorders involving the immune mechanism: Secondary | ICD-10-CM | POA: Diagnosis not present

## 2022-10-24 DIAGNOSIS — Z1389 Encounter for screening for other disorder: Secondary | ICD-10-CM | POA: Diagnosis not present

## 2022-10-24 DIAGNOSIS — Z1231 Encounter for screening mammogram for malignant neoplasm of breast: Secondary | ICD-10-CM | POA: Diagnosis not present

## 2022-10-24 DIAGNOSIS — Z780 Asymptomatic menopausal state: Secondary | ICD-10-CM | POA: Diagnosis not present

## 2022-10-24 DIAGNOSIS — Z01419 Encounter for gynecological examination (general) (routine) without abnormal findings: Secondary | ICD-10-CM | POA: Diagnosis not present

## 2022-11-10 DIAGNOSIS — R311 Benign essential microscopic hematuria: Secondary | ICD-10-CM | POA: Diagnosis not present

## 2022-11-10 DIAGNOSIS — N35811 Other urethral stricture, male, meatal: Secondary | ICD-10-CM | POA: Diagnosis not present

## 2022-11-13 ENCOUNTER — Other Ambulatory Visit: Payer: Self-pay | Admitting: Urology

## 2022-11-27 ENCOUNTER — Ambulatory Visit: Payer: BC Managed Care – PPO | Admitting: Family Medicine

## 2022-11-27 ENCOUNTER — Encounter: Payer: Self-pay | Admitting: Family Medicine

## 2022-11-27 ENCOUNTER — Encounter (HOSPITAL_BASED_OUTPATIENT_CLINIC_OR_DEPARTMENT_OTHER): Payer: Self-pay | Admitting: Urology

## 2022-11-27 VITALS — BP 130/80 | HR 66 | Temp 98.1°F | Resp 18 | Ht 64.0 in | Wt 125.0 lb

## 2022-11-27 DIAGNOSIS — Q0700 Arnold-Chiari syndrome without spina bifida or hydrocephalus: Secondary | ICD-10-CM

## 2022-11-27 DIAGNOSIS — E785 Hyperlipidemia, unspecified: Secondary | ICD-10-CM

## 2022-11-27 LAB — COMPREHENSIVE METABOLIC PANEL
ALT: 14 U/L (ref 0–35)
AST: 19 U/L (ref 0–37)
Albumin: 4.2 g/dL (ref 3.5–5.2)
Alkaline Phosphatase: 62 U/L (ref 39–117)
BUN: 13 mg/dL (ref 6–23)
CO2: 30 mEq/L (ref 19–32)
Calcium: 10 mg/dL (ref 8.4–10.5)
Chloride: 105 mEq/L (ref 96–112)
Creatinine, Ser: 0.88 mg/dL (ref 0.40–1.20)
GFR: 74.99 mL/min (ref >60.00)
Glucose, Bld: 88 mg/dL (ref 70–99)
Potassium: 4.5 mEq/L (ref 3.5–5.1)
Sodium: 143 mEq/L (ref 135–145)
Total Bilirubin: 0.7 mg/dL (ref 0.2–1.2)
Total Protein: 7 g/dL (ref 6.0–8.3)

## 2022-11-27 LAB — LIPID PANEL
Cholesterol: 151 mg/dL (ref 0–200)
HDL: 59.9 mg/dL (ref >39.00)
LDL Cholesterol: 81 mg/dL (ref 0–99)
NonHDL: 90.68
Total CHOL/HDL Ratio: 3
Triglycerides: 50 mg/dL (ref 0.0–149.0)
VLDL: 10 mg/dL (ref 0.0–40.0)

## 2022-11-27 NOTE — Assessment & Plan Note (Signed)
Encourage heart healthy diet such as MIND or DASH diet, increase exercise, avoid trans fats, simple carbohydrates and processed foods, consider a krill or fish or flaxseed oil cap daily.  °

## 2022-11-27 NOTE — Progress Notes (Signed)
11/27/2022 1:13 PM Spoke w/ via phone for pre-op interview---patient Lab needs dos----  none             Lab results------CMP, lipids in [facility] 11/27/22 COVID test -----patient states asymptomatic no test needed Arrive at -------1020 NPO after MN NO Solid Food.  Clear liquids from MN until---0820 Med rec completed Medications to take morning of surgery -----none Diabetic medication -----na Patient instructed no nail polish to be worn day of surgery Patient instructed to bring photo id and insurance card day of surgery Patient aware to have Driver (ride ) / caregiver    for 24 hours after surgery (Sister, Janan Ridge [phone]) Patient Special Instructions -----na Pre-Op special Istructions -----na Patient verbalized understanding of instructions that were given at this phone interview. Patient denies shortness of breath, chest pain, fever, cough at this phone interview.  Dalon Reichart, [place]ville Lime

## 2022-11-27 NOTE — Patient Instructions (Signed)

## 2022-11-27 NOTE — Progress Notes (Signed)
Subjective:   By signing my name below, I, Marlana Latus, attest that this documentation has been prepared under the direction and in the presence of Ann Held, DO 11/27/22   Patient ID: Marilyn Green, female    DOB: 16-Sep-1971, 52 y.o.   MRN: [id number]  Chief Complaint  Patient presents with   Hyperlipidemia   Follow-up    HPI Patient is in today for a 6 month follow up.   She is overall well today.  She is compliant with her rosuvastatin 10 mg daily.   Lab Results  Component Value Date   CHOL 151 11/27/2022   HDL 59.90 11/27/2022   LDLCALC 81 11/27/2022   TRIG 50.0 11/27/2022   CHOLHDL 3 11/27/2022   She reports having Covid in 06/2022 in which she lost her sense of taste and smell. She has since not gotten these senses back. She reports that she has cravings and tends to be hungry more. She can very occasionally taste salty or sweet foods.   Past Medical History:  Diagnosis Date   Allergy    Diverticulitis    Family history of adverse reaction to anesthesia    mom PONV and trouble wakening and sister PONV   Migraine     Past Surgical History:  Procedure Laterality Date   COLONOSCOPY  2020    Family History  Problem Relation Age of Onset   Hyperlipidemia Mother    Hypertension Mother    Colon cancer Neg Hx    Esophageal cancer Neg Hx    Rectal cancer Neg Hx    Stomach cancer Neg Hx     Social History   Socioeconomic History   Marital status: Single    Spouse name: Not on file   Number of children: Not on file   Years of education: Not on file   Highest education level: Not on file  Occupational History   Not on file  Tobacco Use   Smoking status: Never   Smokeless tobacco: Never  Vaping Use   Vaping Use: Never used  Substance and Sexual Activity   Alcohol use: Not Currently   Drug use: No   Sexual activity: Not Currently    Partners: Male  Other Topics Concern   Not on file  Social History Narrative   Exercise- no   Social  Determinants of Health   Financial Resource Strain: Not on file  Food Insecurity: Not on file  Transportation Needs: Not on file  Physical Activity: Not on file  Stress: Not on file  Social Connections: Not on file  Intimate Partner Violence: Not on file    Outpatient Medications Prior to Visit  Medication Sig Dispense Refill   acetic acid 2 % otic solution Place 4 drops into both ears 3 (three) times daily. 15 mL 0   Biotin 1000 MCG tablet Take 1,000 mcg by mouth daily.     Cholecalciferol (VITAMIN D) 50 MCG (2000 UT) CAPS Take 2,000 Units by mouth daily.     rosuvastatin (CRESTOR) 10 MG tablet Take 1 tablet (10 mg total) by mouth daily. 30 tablet 2   No facility-administered medications prior to visit.    No Known Allergies  Review of Systems  Constitutional:  Negative for chills, fever and malaise/fatigue.  HENT:  Negative for congestion and hearing loss.        (+) Loss of taste/smell due to Covid in 06/2022.   Eyes:  Negative for discharge.  Respiratory:  Negative for cough,  sputum production and shortness of breath.   Cardiovascular:  Negative for chest pain, palpitations and leg swelling.  Gastrointestinal:  Negative for abdominal pain, blood in stool, constipation, diarrhea, heartburn, nausea and vomiting.  Genitourinary:  Negative for dysuria, frequency, hematuria and urgency.  Musculoskeletal:  Negative for back pain, falls and myalgias.  Skin:  Negative for rash.  Neurological:  Negative for dizziness, sensory change, loss of consciousness, weakness and headaches.  Endo/Heme/Allergies:  Negative for environmental allergies. Does not bruise/bleed easily.  Psychiatric/Behavioral:  Negative for depression and suicidal ideas. The patient is not nervous/anxious and does not have insomnia.        Objective:    Physical Exam Vitals and nursing note reviewed.  Constitutional:      Appearance: She is well-developed.  HENT:     Head: Normocephalic and atraumatic.   Eyes:     Conjunctiva/sclera: Conjunctivae normal.  Neck:     Thyroid: No thyromegaly.     Vascular: No carotid bruit or JVD.  Cardiovascular:     Rate and Rhythm: Normal rate and regular rhythm.     Heart sounds: Normal heart sounds. No murmur heard. Pulmonary:     Effort: Pulmonary effort is normal. No respiratory distress.     Breath sounds: Normal breath sounds. No wheezing or rales.  Chest:     Chest wall: No tenderness.  Musculoskeletal:     Cervical back: Normal range of motion and neck supple.  Neurological:     General: No focal deficit present.     Mental Status: She is alert and oriented to person, place, and time.     BP 130/80 (BP Location: Left Arm, Patient Position: Sitting, Cuff Size: Normal)   Pulse 66   Temp 98.1 F (36.7 C) (Oral)   Resp 18   Ht '5\' 4"'$  (1.626 m)   Wt 125 lb (56.7 kg)   LMP 01/25/2014   SpO2 100%   BMI 21.46 kg/m  Wt Readings from Last 3 Encounters:  11/27/22 125 lb (56.7 kg)  05/26/22 127 lb 9.6 oz (57.9 kg)  05/12/22 131 lb 3.2 oz (59.5 kg)       Assessment & Plan:  Hyperlipidemia, unspecified hyperlipidemia type Assessment & Plan: Encourage heart healthy diet such as MIND or DASH diet, increase exercise, avoid trans fats, simple carbohydrates and processed foods, consider a krill or fish or flaxseed oil cap daily.    Orders: -     Comprehensive metabolic panel -     Lipid panel  Arnold-Chiari deformity (HCC)     I,Rachel Rivera,acting as a scribe for Ann Held, DO.,have documented all relevant documentation on the behalf of Ann Held, DO,as directed by  Ann Held, DO while in the presence of Ann Held, DO.   I, Ann Held, DO, personally preformed the services described in this documentation.  All medical record entries made by the scribe were at my direction and in my presence.  I have reviewed the chart and discharge instructions (if applicable) and agree that the  record reflects my personal performance and is accurate and complete. 11/27/22   Ann Held, DO

## 2022-11-28 ENCOUNTER — Telehealth: Payer: Self-pay | Admitting: Family Medicine

## 2022-11-28 MED ORDER — ROSUVASTATIN CALCIUM 10 MG PO TABS: 10.0000 mg | ORAL_TABLET | Freq: Every day | ORAL | 2 refills | Status: DC

## 2022-11-28 NOTE — Telephone Encounter (Signed)
Refill sent.

## 2022-11-28 NOTE — Telephone Encounter (Signed)
Prescription Request  11/28/2022  Is this a "Controlled Substance" medicine? No  LOV: 11/27/2022  What is the name of the medication or equipment?   rosuvastatin (CRESTOR) 10 MG tablet [id number]   Have you contacted your pharmacy to request a refill? No   Which pharmacy would you like this sent to?   [id number] - [place], [place] - [address] [place] [id number] Phone: [phone] Fax: [phone]    Patient notified that their request is being sent to the clinical staff for review and that they should receive a response within 2 business days.   Please advise at Mobile [phone] (mobile)

## 2022-11-29 NOTE — H&P (Signed)
CC/HPI: CC: Hematuria   HPI: Marilyn Green is a 52 year old female with a history of asymptomatic microscopic hematuria.   -Patient was seen by Dr. Risa Grill several years ago, but never a had formal CTU or cystoscopy due to her being low risk.  -CT abd/pel from 2020 was unremarkable from a GU standpoint.  -Smoking history: Denies  -History of nephrolithiasis: Denies  -History of CKD: Denies  -Anti-coagulant/anti-platelet use: Denies  -Hematologic disorders: Denies  -History of UTIs: Denies  -History of GU surgery/trauma: Denies  -Personal/family history of GU malignancies: ? hx of bladder cancer involving her mother  -Estrogen status: Post-menopausal--currently on Merena  -From a urinary standpoint, she reports a good FOS and feels like she is emptying her bladder well. She has occasional urgency/frequency, but is not bothered by it. Nocturia x 1. Denies prior episodes of dysuria or gross hematuria.   12/13/20: Here for cystoscopy   11/10/22: 52 year old woman last evaluated for asymptomatic microscopic hematuria and 2022 who is supposed to be scheduled for diagnostic cystoscopy and urethral dilation in the operating room here for follow-up. She continues to have microscopic hematuria. No gross hematuria. See above for the rest of the history. She reports a good urinary stream.     ALLERGIES: No Allergies    MEDICATIONS: Biotin  Rosuvastatin Calcium  Vitamin B-2 50 mg tablet Oral  Vitamin D3 TABS Oral     GU PSH: Cystoscopy - 12/13/2020 Locm 300-'399Mg'$ /Ml Iodine,1Ml - 11/26/2020       PSH Notes: History Of Prior Surgery   NON-GU PSH: No Non-GU PSH    GU PMH: Meatal stenosis (other urethral stricture) - 12/13/2020 Microscopic hematuria, I reviewed patient's CT urogram and gave patient a printout of report. No cause of hematuria identified. I was unable to evaluate bladder due to urethral stenosis. I discussed the risks and benefits of performing urethral dilation with diagnostic  cystoscopy in the operating room. These include but are not limited to pain, bleeding, infection, damage to surrounding structures, need for future intervention. Patient agrees and would like to proceed with surgery. - 12/13/2020, - 11/26/2020, - 11/18/2020 Renal cyst, Small left renal cyst on imaging. Patient was told about this and no further workup necessary. - 12/13/2020 Chronic cystitis (w/o hematuria), Chronic cystitis - 2017 Other microscopic hematuria, Microscopic hematuria - 2017      PMH Notes:  2015-04-12 14:43:09 - Note: No significant past medical history   NON-GU PMH: Encounter for general adult medical examination without abnormal findings, Encounter for preventive health examination - 2017    FAMILY HISTORY: No Significant Family History - Runs In Family   SOCIAL HISTORY: Marital Status: Single Preferred Language: English; Race: Black or African American Current Smoking Status: Patient has never smoked.   Tobacco Use Assessment Completed: Used Tobacco in last 30 days? Drinks 1 caffeinated drink per day. 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     Notes: Never smoker, Caffeine use, Occupation, Engaged to be married, Single, Father deceased, Number of children, Mother's age, Never used tobacco   REVIEW OF SYSTEMS:    GU Review Female:   Patient denies frequent urination, hard to postpone urination, burning /pain with urination, get up at night to urinate, leakage of urine, stream starts and stops, trouble starting your stream, have to strain to urinate, and being pregnant.  Gastrointestinal (Upper):   Patient denies nausea, vomiting, and indigestion/ heartburn.  Gastrointestinal (Lower):   Patient denies diarrhea and constipation.  Constitutional:   Patient denies fever,  night sweats, weight loss, and fatigue.  Skin:   Patient denies skin rash/ lesion and itching.  Eyes:   Patient denies blurred vision and double vision.  Ears/ Nose/ Throat:   Patient  denies sore throat and sinus problems.  Hematologic/Lymphatic:   Patient denies swollen glands and easy bruising.  Cardiovascular:   Patient denies leg swelling and chest pains.  Respiratory:   Patient denies cough and shortness of breath.  Endocrine:   Patient denies excessive thirst.  Musculoskeletal:   Patient denies back pain and joint pain.  Neurological:   Patient denies headaches and dizziness.  Psychologic:   Patient denies depression and anxiety.   VITAL SIGNS:      11/10/2022 08:56 AM  Weight 126 lb / 57.15 kg  Height 64.5 in / 163.83 cm  BP 125/81 mmHg  Pulse 87 /min  Temperature 97.3 F / 36.2 C  BMI 21.3 kg/m   MULTI-SYSTEM PHYSICAL EXAMINATION:    Constitutional: Well-nourished. No physical deformities. Normally developed. Good grooming.  Neck: Neck symmetrical, not swollen. Normal tracheal position.  Respiratory: No labored breathing, no use of accessory muscles.   Skin: No paleness, no jaundice, no cyanosis. No lesion, no ulcer, no rash.  Neurologic / Psychiatric: Oriented to time, oriented to place, oriented to person. No depression, no anxiety, no agitation.  Eyes: Normal conjunctivae. Normal eyelids.  Ears, Nose, Mouth, and Throat: Left ear no scars, no lesions, no masses. Right ear no scars, no lesions, no masses. Nose no scars, no lesions, no masses. Normal hearing. Normal lips.  Musculoskeletal: Normal gait and station of head and neck.     Complexity of Data:  Records Review:   Previous Patient Records, POC Tool  Urine Test Review:   Urinalysis  Notes:                     08/31/2022: BUN 14, creatinine 0.93, estimated GFR 70.3   PROCEDURES:          Urinalysis w/Scope Dipstick Dipstick Cont'd Micro  Color: Yellow Bilirubin: Neg mg/dL WBC/hpf: NS (Not Seen)  Appearance: Clear Ketones: Neg mg/dL RBC/hpf: 3 - 10/hpf  Specific Gravity: 1.015 Blood: 3+ ery/uL Bacteria: NS (Not Seen)  pH: 5.5 Protein: Neg mg/dL Cystals: NS (Not Seen)  Glucose: Neg mg/dL  Urobilinogen: 0.2 mg/dL Casts: NS (Not Seen)    Nitrites: Neg Trichomonas: Not Present    Leukocyte Esterase: Neg leu/uL Mucous: Present      Epithelial Cells: 0 - 5/hpf      Yeast: NS (Not Seen)      Sperm: Not Present    ASSESSMENT:      ICD-10 Details  1 GU:   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 As this was unable to be performed in the office due to narrow urethral meatus we discussed proceeding in the operating room. Risks and benefits of the procedure were discussed with the patient in detail including but not limited to pain, bleeding, infection, dysuria, demonstrating structures, need for additional treatment. Patient be scheduled for next available procedure under MAC.

## 2022-12-04 NOTE — Anesthesia Preprocedure Evaluation (Signed)
Anesthesia Evaluation  Patient identified by MRN, date of birth, ID band Patient awake    Reviewed: Allergy & Precautions, NPO status , Patient's Chart, lab work & pertinent test results  Airway Mallampati: I  TM Distance: >3 FB Neck ROM: Full    Dental no notable dental hx. (+) Teeth Intact, Dental Advisory Given   Pulmonary neg pulmonary ROS   Pulmonary exam normal breath sounds clear to auscultation       Cardiovascular Exercise Tolerance: Good negative cardio ROS Normal cardiovascular exam Rhythm:Regular Rate:Normal     Neuro/Psych negative neurological ROS  negative psych ROS   GI/Hepatic negative GI ROS, Neg liver ROS,,,  Endo/Other    Renal/GU   Female GU complaint (Hematuria)     Musculoskeletal   Abdominal   Peds  Hematology   Anesthesia Other Findings   Reproductive/Obstetrics negative OB ROS                             Anesthesia Physical Anesthesia Plan  ASA: 1  Anesthesia Plan: MAC   Post-op Pain Management: Tylenol PO (pre-op)* and Precedex   Induction: Intravenous  PONV Risk Score and Plan: 4 or greater and Treatment may vary due to age or medical condition, Dexamethasone, Ondansetron and Propofol infusion  Airway Management Planned: Nasal Cannula and Natural Airway  Additional Equipment: None  Intra-op Plan:   Post-operative Plan:   Informed Consent: I have reviewed the patients History and Physical, chart, labs and discussed the procedure including the risks, benefits and alternatives for the proposed anesthesia with the patient or authorized representative who has indicated his/her understanding and acceptance.     Dental advisory given  Plan Discussed with: CRNA and Surgeon  Anesthesia Plan Comments:         Anesthesia Quick Evaluation

## 2022-12-05 ENCOUNTER — Ambulatory Visit (HOSPITAL_BASED_OUTPATIENT_CLINIC_OR_DEPARTMENT_OTHER): Payer: BC Managed Care – PPO | Admitting: Anesthesiology

## 2022-12-05 ENCOUNTER — Encounter (HOSPITAL_BASED_OUTPATIENT_CLINIC_OR_DEPARTMENT_OTHER): Payer: Self-pay | Admitting: Urology

## 2022-12-05 ENCOUNTER — Ambulatory Visit (HOSPITAL_BASED_OUTPATIENT_CLINIC_OR_DEPARTMENT_OTHER)
Admission: RE | Admit: 2022-12-05 | Discharge: 2022-12-05 | Disposition: A | Discharge status: Home / Self Care | Payer: BC Managed Care – PPO | Attending: Urology | Admitting: Urology

## 2022-12-05 ENCOUNTER — Encounter (HOSPITAL_BASED_OUTPATIENT_CLINIC_OR_DEPARTMENT_OTHER)
Admission: RE | Disposition: A | Discharge status: Home / Self Care | Payer: Self-pay | Source: Home / Self Care | Attending: Urology

## 2022-12-05 DIAGNOSIS — R310 Gross hematuria: Secondary | ICD-10-CM | POA: Diagnosis not present

## 2022-12-05 DIAGNOSIS — N3592 Unspecified urethral stricture, female: Secondary | ICD-10-CM | POA: Insufficient documentation

## 2022-12-05 DIAGNOSIS — N35919 Unspecified urethral stricture, male, unspecified site: Secondary | ICD-10-CM | POA: Diagnosis not present

## 2022-12-05 DIAGNOSIS — Z9889 Other specified postprocedural states: Secondary | ICD-10-CM

## 2022-12-05 DIAGNOSIS — R3129 Other microscopic hematuria: Secondary | ICD-10-CM | POA: Diagnosis not present

## 2022-12-05 HISTORY — PX: CYSTOSCOPY WITH URETHRAL DILATATION: SHX5125

## 2022-12-05 HISTORY — DX: Family history of other specified conditions: Z84.89

## 2022-12-05 SURGERY — CYSTOSCOPY, WITH URETHRAL DILATION
Anesthesia: General

## 2022-12-05 MED ORDER — LIDOCAINE 2% (20 MG/ML) 5 ML SYRINGE
INTRAMUSCULAR | Status: DC | PRN
  Administered 2022-12-05: 40 mg via INTRAVENOUS

## 2022-12-05 MED ORDER — FENTANYL CITRATE (PF) 250 MCG/5ML IJ SOLN
INTRAMUSCULAR | Status: DC | PRN
  Administered 2022-12-05 (×2): 25 ug via INTRAVENOUS

## 2022-12-05 MED ORDER — LIDOCAINE HCL (PF) 2 % IJ SOLN
INTRAMUSCULAR | Status: AC
  Filled 2022-12-05: qty 5

## 2022-12-05 MED ORDER — OXYCODONE HCL 5 MG PO TABS: 5.0000 mg | ORAL_TABLET | Freq: Once | ORAL | Status: DC | PRN

## 2022-12-05 MED ORDER — ACETAMINOPHEN 10 MG/ML IV SOLN: 1000.0000 mg | Freq: Once | INTRAVENOUS | Status: DC | PRN

## 2022-12-05 MED ORDER — PROPOFOL 10 MG/ML IV BOLUS
INTRAVENOUS | Status: AC
  Filled 2022-12-05: qty 20

## 2022-12-05 MED ORDER — LACTATED RINGERS IV SOLN: INTRAVENOUS | Status: DC

## 2022-12-05 MED ORDER — CEFAZOLIN SODIUM-DEXTROSE 2-4 GM/100ML-% IV SOLN
2.0000 g | INTRAVENOUS | Status: AC
  Administered 2022-12-05: 2 g via INTRAVENOUS

## 2022-12-05 MED ORDER — STERILE WATER FOR IRRIGATION IR SOLN
Status: DC | PRN
  Administered 2022-12-05: 3000 mL

## 2022-12-05 MED ORDER — CEFAZOLIN SODIUM-DEXTROSE 2-4 GM/100ML-% IV SOLN
INTRAVENOUS | Status: AC
  Filled 2022-12-05: qty 100

## 2022-12-05 MED ORDER — PROPOFOL 500 MG/50ML IV EMUL
INTRAVENOUS | Status: DC | PRN
  Administered 2022-12-05: 180 ug/kg/min via INTRAVENOUS

## 2022-12-05 MED ORDER — AMISULPRIDE (ANTIEMETIC) 5 MG/2ML IV SOLN
INTRAVENOUS | Status: AC
  Filled 2022-12-05: qty 2

## 2022-12-05 MED ORDER — FENTANYL CITRATE (PF) 100 MCG/2ML IJ SOLN
INTRAMUSCULAR | Status: AC
  Filled 2022-12-05: qty 2

## 2022-12-05 MED ORDER — CEFAZOLIN SODIUM-DEXTROSE 2-4 GM/100ML-% IV SOLN: 2.0000 g | INTRAVENOUS | Status: DC

## 2022-12-05 MED ORDER — HYDROMORPHONE HCL 1 MG/ML IJ SOLN: 0.2500 mg | INTRAMUSCULAR | Status: DC | PRN

## 2022-12-05 MED ORDER — AMISULPRIDE (ANTIEMETIC) 5 MG/2ML IV SOLN
10.0000 mg | Freq: Once | INTRAVENOUS | Status: AC | PRN
  Administered 2022-12-05: 10 mg via INTRAVENOUS

## 2022-12-05 MED ORDER — ONDANSETRON HCL 4 MG/2ML IJ SOLN
INTRAMUSCULAR | Status: AC
  Filled 2022-12-05: qty 2

## 2022-12-05 MED ORDER — MIDAZOLAM HCL 2 MG/2ML IJ SOLN
INTRAMUSCULAR | Status: AC
  Filled 2022-12-05: qty 2

## 2022-12-05 MED ORDER — ONDANSETRON HCL 4 MG/2ML IJ SOLN: 4.0000 mg | Freq: Once | INTRAMUSCULAR | Status: DC | PRN

## 2022-12-05 MED ORDER — ONDANSETRON HCL 4 MG/2ML IJ SOLN
INTRAMUSCULAR | Status: DC | PRN
  Administered 2022-12-05: 4 mg via INTRAVENOUS

## 2022-12-05 MED ORDER — OXYCODONE HCL 5 MG/5ML PO SOLN: 5.0000 mg | Freq: Once | ORAL | Status: DC | PRN

## 2022-12-05 MED ORDER — PROPOFOL 10 MG/ML IV BOLUS
INTRAVENOUS | Status: DC | PRN
  Administered 2022-12-05: 20 mg via INTRAVENOUS

## 2022-12-05 MED ORDER — MIDAZOLAM HCL 2 MG/2ML IJ SOLN
INTRAMUSCULAR | Status: DC | PRN
  Administered 2022-12-05: 2 mg via INTRAVENOUS

## 2022-12-05 MED ORDER — DEXAMETHASONE SODIUM PHOSPHATE 10 MG/ML IJ SOLN
INTRAMUSCULAR | Status: AC
  Filled 2022-12-05: qty 1

## 2022-12-05 SURGICAL SUPPLY — 21 items
BAG DRAIN URO-CYSTO SKYTR STRL (DRAIN) ×1 IMPLANT
BAG DRN UROCATH (DRAIN) ×1
BALLN NEPHROSTOMY (BALLOONS)
BALLN OPTILUME DCB 30X3X75 (BALLOONS)
BALLN OPTILUME DCB 30X5X75 (BALLOONS)
BALLOON NEPHROSTOMY (BALLOONS) IMPLANT
BALLOON OPTILUME DCB 30X3X75 (BALLOONS) IMPLANT
BALLOON OPTILUME DCB 30X5X75 (BALLOONS) IMPLANT
CLOTH BEACON ORANGE TIMEOUT ST (SAFETY) ×1 IMPLANT
ELECT REM PT RETURN 9FT ADLT (ELECTROSURGICAL) ×1
ELECTRODE REM PT RTRN 9FT ADLT (ELECTROSURGICAL) ×1 IMPLANT
GLOVE BIO SURGEON STRL SZ 6.5 (GLOVE) ×1 IMPLANT
GOWN STRL REUS W/TWL LRG LVL3 (GOWN DISPOSABLE) ×1 IMPLANT
GUIDEWIRE STR DUAL SENSOR (WIRE) IMPLANT
KIT TURNOVER CYSTO (KITS) ×1 IMPLANT
MANIFOLD NEPTUNE II (INSTRUMENTS) ×1 IMPLANT
PACK CYSTO (CUSTOM PROCEDURE TRAY) ×1 IMPLANT
SLEEVE SCD COMPRESS KNEE MED (STOCKING) ×1 IMPLANT
TUBE CONNECTING 12X1/4 (SUCTIONS) ×1 IMPLANT
TUBING UROLOGY SET (TUBING) ×1 IMPLANT
WATER STERILE IRR 3000ML UROMA (IV SOLUTION) ×1 IMPLANT

## 2022-12-05 NOTE — Discharge Instructions (Addendum)
Cystoscopy with urethral dilation patient instructions  Following a cystoscopy, a catheter (a flexible rubber tube) is sometimes left in place to empty the bladder. This may cause some discomfort or a feeling that you need to urinate. Your doctor determines the period of time that the catheter will be left in place. You may have bloody urine for two to three days (Call your doctor if the amount of bleeding increases or does not subside).  You may pass blood clots in your urine, especially if you had a biopsy. It is not unusual to pass small blood clots and have some bloody urine a couple of weeks after your cystoscopy. Again, call your doctor if the bleeding does not subside. You may have: Dysuria (painful urination) Frequency (urinating often) Urgency (strong desire to urinate)  These symptoms are common especially if medicine is instilled into the bladder or a ureteral stent is placed. Avoiding alcohol and caffeine, such as coffee, tea, and chocolate, may help relieve these symptoms. Drink plenty of water, unless otherwise instructed. Your doctor may also prescribe an antibiotic or other medicine to reduce these symptoms.  AZO can help with burning with urination. This is over the counter at the pharmacy. You can also use tylenol and ibuprofen.  Cystoscopy results are available soon after the procedure; biopsy results usually take two to four days. Your doctor will discuss the results of your exam with you. Before you go home, you will be given specific instructions for follow-up care. Special Instructions:   If you are going home with a catheter in place do not take a tub bath until removed by your doctor.   You may resume your normal activities.   Do not drive or operate machinery if you are taking narcotic pain medicine.   Be sure to keep all follow-up appointments with your doctor.   Call Your Doctor If: The catheter is not draining You have severe pain You are unable to urinate You  have a fever over 101 You have severe bleeding          Post Anesthesia Home Care Instructions  Activity: Get plenty of rest for the remainder of the day. A responsible adult should stay with you for 24 hours following the procedure.  For the next 24 hours, DO NOT: -Drive a car -Paediatric nurse -Drink alcoholic beverages -Take any medication unless instructed by your physician -Make any legal decisions or sign important papers.  Meals: Start with liquid foods such as gelatin or soup. Progress to regular foods as tolerated. Avoid greasy, spicy, heavy foods. If nausea and/or vomiting occur, drink only clear liquids until the nausea and/or vomiting subsides. Call your physician if vomiting continues.  Special Instructions/Symptoms: Your throat may feel dry or sore from the anesthesia or the breathing tube placed in your throat during surgery. If this causes discomfort, gargle with warm salt water. The discomfort should disappear within 24 hours.

## 2022-12-05 NOTE — Transfer of Care (Signed)
Pt to PACU stable

## 2022-12-05 NOTE — Op Note (Signed)
Operative Note  Preoperative diagnosis:  1.  Microscopic hematuria 2.  Urethral stricture  Postoperative diagnosis: 1.  Microscopic hematuria 2.  Urethral stricture  Procedure(s): 1.  Urethral dilation 10Fr - 24Fr 2.  Diagnostic cystoscopy  Surgeon: Jacalyn Lefevre, MD  Assistants:  None  Anesthesia:  MAC  Complications:  None  EBL:  minimal  Specimens: 1. none  Drains/Catheters: 1.  none  Intraoperative findings:   Narrow caliber urethra dilated from 10Fr - 24Fr  Urethra appeared normal after dilation Bilateral orthotopic ureteral orifices effluxing clear urine Normal bladder mucosa without masses or irregularities  Indication:  Marilyn Green is a 52 y.o. female with microscopic hematuria and narrow urethra which prevented diagnostic cystoscopy in the office  Description of procedure:  After risks and benefits of the procedure discussed the patient, informed consent was obtained.  The patient taken the operating placed in supine position.  Anesthesia was induced and antibiotics were administered.  The patient was repositioned in the dorsolithotomy position.  She was prepped and draped in the usual sterile fashion and timeout was performed.  Urethral sounds were then used to dilate the urethra from 10 [place] to 24 [place].  There was gentle resistance met during dilation but the sounds passed easily.  Next a 99 French rigid cystoscope was placed in the urethral meatus and advanced in the bladder under direct visualization.  The bladder was systematically examined and no abnormalities were noted.  The patient's bladder was decompressed and the cystoscope was removed.  The patient emerged from anesthesia and sent to the PACU in stable condition.  Plan: She tolerated the procedure well and there was no abnormality or source of hematuria identified.

## 2022-12-05 NOTE — Anesthesia Postprocedure Evaluation (Signed)
Anesthesia Post Note  Patient: Marilyn Green  Procedure(s) Performed: CYSTOSCOPY WITH URETHRAL DILATATION     Patient location during evaluation: PACU Anesthesia Type: MAC Level of consciousness: awake and alert Pain management: pain level controlled Vital Signs Assessment: post-procedure vital signs reviewed and stable Respiratory status: spontaneous breathing, nonlabored ventilation, respiratory function stable and patient connected to nasal cannula oxygen Cardiovascular status: stable and blood pressure returned to baseline Postop Assessment: no apparent nausea or vomiting Anesthetic complications: no  No notable events documented.  Last Vitals:  Vitals:   12/05/22 1219 12/05/22 1238  BP: (!) 161/85 (!) 145/89  Pulse: 68 66  Resp: 14   Temp: 36.4 C   SpO2: 100% 100%    Last Pain:  Vitals:   12/05/22 1238  TempSrc:   PainSc: 0-No pain                 Barnet Glasgow

## 2022-12-05 NOTE — Interval H&P Note (Signed)
History and Physical Interval Note:  12/05/2022 10:08 AM  Marilyn Green  has presented today for surgery, with the diagnosis of MICROSCOPIC HEMATURIA, URETHRAL STRICTURE.  The various methods of treatment have been discussed with the patient and family. After consideration of risks, benefits and other options for treatment, the patient has consented to  Procedure(s): CYSTOSCOPY WITH URETHRAL DILATATION (N/A) as a surgical intervention.  The patient's history has been reviewed, patient examined, no change in status, stable for surgery.  I have reviewed the patient's chart and labs.  Questions were answered to the patient's satisfaction.     Jru Pense D Algis Lehenbauer

## 2022-12-06 ENCOUNTER — Encounter (HOSPITAL_BASED_OUTPATIENT_CLINIC_OR_DEPARTMENT_OTHER): Payer: Self-pay | Admitting: Urology

## 2022-12-15 DIAGNOSIS — N35819 Other urethral stricture, male, unspecified site: Secondary | ICD-10-CM | POA: Diagnosis not present

## 2022-12-15 DIAGNOSIS — R311 Benign essential microscopic hematuria: Secondary | ICD-10-CM | POA: Diagnosis not present

## 2023-02-24 ENCOUNTER — Other Ambulatory Visit: Payer: Self-pay | Admitting: Family Medicine

## 2023-02-27 ENCOUNTER — Telehealth: Payer: Self-pay | Admitting: Family Medicine

## 2023-02-27 MED ORDER — ROSUVASTATIN CALCIUM 10 MG PO TABS: 10.0000 mg | ORAL_TABLET | Freq: Every day | ORAL | 2 refills | Status: DC

## 2023-02-27 NOTE — Telephone Encounter (Signed)
Prescription Request  02/27/2023  Is this a "Controlled Substance" medicine? No  LOV: 11/27/2022  What is the name of the medication or equipment? rosuvastatin (CRESTOR) 10 MG tablet   Have you contacted your pharmacy to request a refill? No   Which pharmacy would you like this sent to?   [id number] - [place], [place] - [address] [place] [address] Phone: [phone] Fax: [phone]    Patient notified that their request is being sent to the clinical staff for review and that they should receive a response within 2 business days.   Please advise at Mobile [phone] (mobile)

## 2023-02-27 NOTE — Telephone Encounter (Signed)
Refill sent.

## 2023-02-27 NOTE — Addendum Note (Signed)
Addended by: Roxanne Gates on: 02/27/2023 09:56 AM   Modules accepted: Orders

## 2023-05-14 ENCOUNTER — Encounter: Payer: Self-pay | Admitting: Family Medicine

## 2023-05-14 ENCOUNTER — Ambulatory Visit: Payer: BC Managed Care – PPO | Admitting: Family Medicine

## 2023-05-14 VITALS — BP 120/80 | HR 83 | Temp 97.7°F | Resp 18 | Ht 64.5 in | Wt 129.6 lb

## 2023-05-14 DIAGNOSIS — E785 Hyperlipidemia, unspecified: Secondary | ICD-10-CM | POA: Diagnosis not present

## 2023-05-14 DIAGNOSIS — Z0000 Encounter for general adult medical examination without abnormal findings: Secondary | ICD-10-CM | POA: Diagnosis not present

## 2023-05-14 LAB — COMPREHENSIVE METABOLIC PANEL
ALT: 15 U/L (ref 0–35)
AST: 17 U/L (ref 0–37)
Albumin: 4.2 g/dL (ref 3.5–5.2)
Alkaline Phosphatase: 64 U/L (ref 39–117)
BUN: 16 mg/dL (ref 6–23)
CO2: 31 mEq/L (ref 19–32)
Calcium: 9.5 mg/dL (ref 8.4–10.5)
Chloride: 102 mEq/L (ref 96–112)
Creatinine, Ser: 0.92 mg/dL (ref 0.40–1.20)
GFR: 70.87 mL/min (ref >60.00)
Glucose, Bld: 87 mg/dL (ref 70–99)
Potassium: 4 mEq/L (ref 3.5–5.1)
Sodium: 138 mEq/L (ref 135–145)
Total Bilirubin: 0.6 mg/dL (ref 0.2–1.2)
Total Protein: 6.6 g/dL (ref 6.0–8.3)

## 2023-05-14 LAB — CBC WITH DIFFERENTIAL/PLATELET
Basophils Absolute: 0 10*3/uL (ref 0.0–0.1)
Basophils Relative: 0.7 % (ref 0.0–3.0)
Eosinophils Absolute: 0.1 10*3/uL (ref 0.0–0.7)
Eosinophils Relative: 2.4 % (ref 0.0–5.0)
HCT: 41.8 % (ref 36.0–46.0)
Hemoglobin: 13.1 g/dL (ref 12.0–15.0)
Lymphocytes Relative: 42.8 % (ref 12.0–46.0)
Lymphs Abs: 1.3 10*3/uL (ref 0.7–4.0)
MCHC: 31.2 g/dL (ref 30.0–36.0)
MCV: 88.6 fl (ref 78.0–100.0)
Monocytes Absolute: 0.3 10*3/uL (ref 0.1–1.0)
Monocytes Relative: 8.1 % (ref 3.0–12.0)
Neutro Abs: 1.4 10*3/uL (ref 1.4–7.7)
Neutrophils Relative %: 46 % (ref 43.0–77.0)
Platelets: 191 10*3/uL (ref 150.0–400.0)
RBC: 4.72 Mil/uL (ref 3.87–5.11)
RDW: 13.4 % (ref 11.5–15.5)
WBC: 3.1 10*3/uL — ABNORMAL LOW (ref 4.0–10.5)

## 2023-05-14 LAB — LIPID PANEL
Cholesterol: 161 mg/dL (ref 0–200)
HDL: 63.9 mg/dL (ref >39.00)
LDL Cholesterol: 86 mg/dL (ref 0–99)
NonHDL: 97.59
Total CHOL/HDL Ratio: 3
Triglycerides: 56 mg/dL (ref 0.0–149.0)
VLDL: 11.2 mg/dL (ref 0.0–40.0)

## 2023-05-14 LAB — TSH: TSH: 2.14 u[IU]/mL (ref 0.35–5.50)

## 2023-05-14 MED ORDER — ROSUVASTATIN CALCIUM 10 MG PO TABS: 10.0000 mg | ORAL_TABLET | Freq: Every day | ORAL | 2 refills | Status: DC

## 2023-05-14 NOTE — Assessment & Plan Note (Signed)
Tolerating statin, encouraged heart healthy diet, avoid trans fats, minimize simple carbs and saturated fats. Increase exercise as tolerated 

## 2023-05-14 NOTE — Patient Instructions (Signed)
Preventive Care 54-54 Years Old, Female Preventive care refers to lifestyle choices and visits with your health care provider that can promote health and wellness. Preventive care visits are also called wellness exams. What can I expect for my preventive care visit? Counseling Your health care provider may ask you questions about your: Medical history, including: Past medical problems. Family medical history. Pregnancy history. Current health, including: Menstrual cycle. Method of birth control. Emotional well-being. Home life and relationship well-being. Sexual activity and sexual health. Lifestyle, including: Alcohol, nicotine or tobacco, and drug use. Access to firearms. Diet, exercise, and sleep habits. Work and work environment. Sunscreen use. Safety issues such as seatbelt and bike helmet use. Physical exam Your health care provider will check your: Height and weight. These may be used to calculate your BMI (body mass index). BMI is a measurement that tells if you are at a healthy weight. Waist circumference. This measures the distance around your waistline. This measurement also tells if you are at a healthy weight and may help predict your risk of certain diseases, such as type 2 diabetes and high blood pressure. Heart rate and blood pressure. Body temperature. Skin for abnormal spots. What immunizations do I need?  Vaccines are usually given at various ages, according to a schedule. Your health care provider will recommend vaccines for you based on your age, medical history, and lifestyle or other factors, such as travel or where you work. What tests do I need? Screening Your health care provider may recommend screening tests for certain conditions. This may include: Lipid and cholesterol levels. Diabetes screening. This is done by checking your blood sugar (glucose) after you have not eaten for a while (fasting). Pelvic exam and Pap test. Hepatitis B test. Hepatitis C  test. HIV (human immunodeficiency virus) test. STI (sexually transmitted infection) testing, if you are at risk. Lung cancer screening. Colorectal cancer screening. Mammogram. Talk with your health care provider about when you should start having regular mammograms. This may depend on whether you have a family history of breast cancer. BRCA-related cancer screening. This may be done if you have a family history of breast, ovarian, tubal, or peritoneal cancers. Bone density scan. This is done to screen for osteoporosis. Talk with your health care provider about your test results, treatment options, and if necessary, the need for more tests. Follow these instructions at home: Eating and drinking  Eat a diet that includes fresh fruits and vegetables, whole grains, lean protein, and low-fat dairy products. Take vitamin and mineral supplements as recommended by your health care provider. Do not drink alcohol if: Your health care provider tells you not to drink. You are pregnant, may be pregnant, or are planning to become pregnant. If you drink alcohol: Limit how much you have to 0-1 drink a day. Know how much alcohol is in your drink. In the [place] one drink equals one 12 oz bottle of beer (355 mL), one 5 oz glass of wine (148 mL), or one 1 oz glass of hard liquor (44 mL). Lifestyle Brush your teeth every morning and night with fluoride toothpaste. Floss one time each day. Exercise for at least 30 minutes 5 or more days each week. Do not use any products that contain nicotine or tobacco. These products include cigarettes, chewing tobacco, and vaping devices, such as e-cigarettes. If you need help quitting, ask your health care provider. Do not use drugs. If you are sexually active, practice safe sex. Use a condom or other form of protection to   prevent STIs. If you do not wish to become pregnant, use a form of birth control. If you plan to become pregnant, see your health care provider for a  prepregnancy visit. Take aspirin only as told by your health care provider. Make sure that you understand how much to take and what form to take. Work with your health care provider to find out whether it is safe and beneficial for you to take aspirin daily. Find healthy ways to manage stress, such as: Meditation, yoga, or listening to music. Journaling. Talking to a trusted person. Spending time with friends and family. Minimize exposure to UV radiation to reduce your risk of skin cancer. Safety Always wear your seat belt while driving or riding in a vehicle. Do not drive: If you have been drinking alcohol. Do not ride with someone who has been drinking. When you are tired or distracted. While texting. If you have been using any mind-altering substances or drugs. Wear a helmet and other protective equipment during sports activities. If you have firearms in your house, make sure you follow all gun safety procedures. Seek help if you have been physically or sexually abused. What's next? Visit your health care provider once a year for an annual wellness visit. Ask your health care provider how often you should have your eyes and teeth checked. Stay up to date on all vaccines. This information is not intended to replace advice given to you by your health care provider. Make sure you discuss any questions you have with your health care provider. Document Revised: 03/16/2021 Document Reviewed: 03/16/2021 Elsevier Patient Education  2024 [employer]  

## 2023-05-14 NOTE — Assessment & Plan Note (Signed)
Ghm utd Check labs  See [facility] Health Maintenance  Topic Date Due   MAMMOGRAM  07/15/2014   PAP SMEAR-Modifier  06/17/2016   COVID-19 Vaccine (3 - 2023-24 season) 06/02/2022   INFLUENZA VACCINE  05/03/2023   HIV Screening  05/13/2024 (Originally 05/29/1986)   Colonoscopy  12/23/2023   DTaP/Tdap/Td (3 - Td or Tdap) 04/27/2031   Hepatitis C Screening  Completed   Zoster Vaccines- Shingrix  Completed   HPV VACCINES  Aged Out

## 2023-05-14 NOTE — Progress Notes (Signed)
Established Patient Office Visit  Subjective   Patient ID: Marilyn Green, female    DOB: 1971-08-18  Age: 52 y.o. MRN: [id number]  Chief Complaint  Patient presents with   Annual Exam    Pt states fasting     HPI Discussed the use of AI scribe software for clinical note transcription with the patient, who gave verbal consent to proceed.  History of Present Illness   The patient presents for an annual physical. She reports that she is generally well, but has been unable to smell or taste for the past month. She notes that she can taste lemony foods briefly before the flavor disappears. She also reports dry, itchy ears, which she attributes to allergies. She has been taking over-the-counter sinus and allergy medications as needed, but has not found consistent relief.  The patient also reports a recent cystoscopy, which confirmed the absence of any concerning findings. She has been referred to the urologist annually by her gynecologist due to the presence of blood in her urine. The urologist has advised that she only needs to return if she observes blood in her urine or if more blood is found in future urine samples.  The patient also mentions that she has noticed new moles appearing on her skin, but none are of concern at this time. She has not had any new surgeries or changes in family history. She denies alcohol, drug use, and smoking, and is not currently sexually active.      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   Vertigo 03/10/2014   Migraine without aura 06/19/2012   Past Medical History:  Diagnosis Date   Allergy    Diverticulitis    Family history of adverse reaction to anesthesia    mom PONV and  trouble wakening and sister PONV   Migraine    Urethral stricture    Past Surgical History:  Procedure Laterality Date   COLONOSCOPY  2020   CYSTOSCOPY WITH URETHRAL DILATATION N/A 12/05/2022   Procedure: CYSTOSCOPY WITH URETHRAL DILATATION;  Surgeon: Noel Christmas, MD;  Location: [facility];  Service: Urology;  Laterality: N/A;   Social History   Tobacco Use   Smoking status: Never   Smokeless tobacco: Never  Vaping Use   Vaping status: Never Used  Substance Use Topics   Alcohol use: Not Currently   Drug use: No   Social History   Socioeconomic History   Marital status: Single    Spouse name: Not on file   Number of children: Not on file   Years of education: Not on file   Highest education level: Not on file  Occupational History   Occupation: finance    Employer: [employer]  Tobacco Use   Smoking status: Never   Smokeless tobacco: Never  Vaping Use   Vaping status: Never Used  Substance and Sexual Activity   Alcohol use: Not Currently   Drug use: No   Sexual activity: Not Currently    Partners: Male  Other Topics Concern   Not on file  Social History Narrative   Exercise- no   Social Determinants of Health   Financial Resource Strain: Not on file  Food Insecurity: Not on file  Transportation  Needs: Not on file  Physical Activity: Not on file  Stress: Not on file  Social Connections: Not on file  Intimate Partner Violence: Not on file   Family Status  Relation Name Status   Mother  Alive   Father  Deceased   Sister  Alive   Sister  Alive   Sister  Alive   Brother  Alive   Brother  Alive   MGM  Deceased   MGF  Deceased   PGM  Deceased   PGF  Deceased   Neg Hx  (Not Specified)  No partnership data on file   Family History  Problem Relation Age of Onset   Hyperlipidemia Mother    Hypertension Mother    Colon cancer Neg Hx    Esophageal cancer Neg Hx    Rectal cancer Neg Hx    Stomach cancer Neg Hx    No Known Allergies     Review of Systems  Constitutional:  Negative for fever and malaise/fatigue.  HENT:  Negative for congestion.   Eyes:  Negative for blurred vision.  Respiratory:  Negative for cough and shortness of breath.   Cardiovascular:  Negative for chest pain, palpitations and leg swelling.  Gastrointestinal:  Negative for abdominal pain, blood in stool, nausea and vomiting.  Genitourinary:  Negative for dysuria and frequency.  Musculoskeletal:  Negative for back pain and falls.  Skin:  Negative for rash.  Neurological:  Negative for dizziness, loss of consciousness and headaches.  Endo/Heme/Allergies:  Negative for environmental allergies.  Psychiatric/Behavioral:  Negative for depression. The patient is not nervous/anxious.       Objective:     BP 120/80 (BP Location: Left Arm, Patient Position: Sitting, Cuff Size: Normal)   Pulse 83   Temp 97.7 F (36.5 C) (Oral)   Resp 18   Ht 5' 4.5" (1.638 m)   Wt 129 lb 9.6 oz (58.8 kg)   LMP 01/25/2014   SpO2 100%   BMI 21.90 kg/m  BP Readings from Last 3 Encounters:  05/14/23 120/80  12/05/22 (!) 145/89  11/27/22 130/80   Wt Readings from Last 3 Encounters:  05/14/23 129 lb 9.6 oz (58.8 kg)  12/05/22 124 lb 11.2 oz (56.6 kg)  11/27/22 125 lb (56.7 kg)   SpO2 Readings from Last 3 Encounters:  05/14/23 100%  12/05/22 100%  11/27/22 100%      Physical Exam Vitals and nursing note reviewed.  Constitutional:      General: She is not in acute distress.    Appearance: Normal appearance. She is well-developed.  HENT:     Head: Normocephalic and atraumatic.     Right Ear: Tympanic membrane, ear canal and external ear normal. There is no impacted cerumen.     Left Ear: Tympanic membrane, ear canal and external ear normal. There is no impacted cerumen.     Nose: Nose normal.     Mouth/Throat:     Mouth: Mucous membranes are moist.     Pharynx: Oropharynx is clear. No oropharyngeal exudate or posterior oropharyngeal erythema.  Eyes:      General: No scleral icterus.       Right eye: No discharge.        Left eye: No discharge.     Conjunctiva/sclera: Conjunctivae normal.     Pupils: Pupils are equal, round, and reactive to light.  Neck:     Thyroid: No thyromegaly or thyroid tenderness.     Vascular: No JVD.  Cardiovascular:  Rate and Rhythm: Normal rate and regular rhythm.     Heart sounds: Normal heart sounds. No murmur heard. Pulmonary:     Effort: Pulmonary effort is normal. No respiratory distress.     Breath sounds: Normal breath sounds.  Abdominal:     General: Bowel sounds are normal. There is no distension.     Palpations: Abdomen is soft. There is no mass.     Tenderness: There is no abdominal tenderness. There is no guarding or rebound.  Genitourinary:    Vagina: Normal.  Musculoskeletal:        General: Normal range of motion.     Cervical back: Normal range of motion and neck supple.     Right lower leg: No edema.     Left lower leg: No edema.  Lymphadenopathy:     Cervical: No cervical adenopathy.  Skin:    General: Skin is warm and dry.     Findings: No erythema or rash.  Neurological:     Mental Status: She is alert and oriented to person, place, and time.     Cranial Nerves: No cranial nerve deficit.     Deep Tendon Reflexes: Reflexes are normal and symmetric.  Psychiatric:        Mood and Affect: Mood normal.        Behavior: Behavior normal.        Thought Content: Thought content normal.        Judgment: Judgment normal.      No results found for any visits on 05/14/23.  Last CBC Lab Results  Component Value Date   WBC 3.2 (L) 05/26/2022   HGB 13.5 05/26/2022   HCT 41.4 05/26/2022   MCV 85.5 05/26/2022   MCH 27.5 12/11/2018   RDW 13.2 05/26/2022   PLT 189.0 05/26/2022   Last metabolic panel Lab Results  Component Value Date   GLUCOSE 88 11/27/2022   NA 143 11/27/2022   K 4.5 11/27/2022   CL 105 11/27/2022   CO2 30 11/27/2022   BUN 13 11/27/2022   CREATININE  0.88 11/27/2022   GFR 74.99 11/27/2022   CALCIUM 10.0 11/27/2022   PROT 7.0 11/27/2022   ALBUMIN 4.2 11/27/2022   BILITOT 0.7 11/27/2022   ALKPHOS 62 11/27/2022   AST 19 11/27/2022   ALT 14 11/27/2022   ANIONGAP 9 12/11/2018   Last lipids Lab Results  Component Value Date   CHOL 151 11/27/2022   HDL 59.90 11/27/2022   LDLCALC 81 11/27/2022   TRIG 50.0 11/27/2022   CHOLHDL 3 11/27/2022   Last hemoglobin A1c No results found for: "HGBA1C" Last thyroid functions Lab Results  Component Value Date   TSH 1.72 05/12/2022   Last vitamin D No results found for: "25OHVITD2", "25OHVITD3", "VD25OH" Last vitamin B12 and Folate No results found for: "VITAMINB12", "FOLATE"    The 10-year ASCVD risk score (Arnett DK, et al., 2019) is: 1.5%    Assessment & Plan:   Problem List Items Addressed This Visit       Unprioritized   Preventative health care - Primary    Ghm utd Check labs  See AVS Health Maintenance  Topic Date Due   MAMMOGRAM  07/15/2014   PAP SMEAR-Modifier  06/17/2016   COVID-19 Vaccine (3 - 2023-24 season) 06/02/2022   INFLUENZA VACCINE  05/03/2023   HIV Screening  05/13/2024 (Originally 05/29/1986)   Colonoscopy  12/23/2023   DTaP/Tdap/Td (3 - Td or Tdap) 04/27/2031   Hepatitis C Screening  Completed  Zoster Vaccines- Shingrix  Completed   HPV VACCINES  Aged Out         Relevant Orders   CBC with Differential/Platelet   Comprehensive metabolic panel   Lipid panel   TSH   Hyperlipidemia    Tolerating statin, encouraged heart healthy diet, avoid trans fats, minimize simple carbs and saturated fats. Increase exercise as tolerated       Relevant Medications   rosuvastatin (CRESTOR) 10 MG tablet   Other Relevant Orders   Comprehensive metabolic panel   Lipid panel  Assessment and Plan    Hyperlipidemia Patient is on Crestor. Discussed potential cost savings with 90-day supply from [facility]. Recent finger prick cholesterol test at work  showed total cholesterol of 119. -Continue Crestor. -Consider obtaining 90-day supply from [facility] for cost savings. -Order lab work to confirm cholesterol levels.  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 Some taste sensation with sour foods. -No specific plan discussed.  Hematuria Recent cystoscopy with urologist showed no concerning findings. Blood in urine noted on routine testing by gynecologist. Urologist to provide note to prevent unnecessary referrals. -No specific plan discussed.  Allergic Rhinitis Complaints of dry, itchy ears. Discussed potential allergy causes and over-the-counter treatment options. -Consider over-the-counter allergy medication such as Claritin, Zyrtec, Xyzal, or Flonase. -Order prescription for generic Flonase.  General Health Maintenance -Continue regular eye and dental exams. -Continue annual mammogram and Pap smear with gynecologist. -Ensure gynecologist sends results of Pap smear and mammogram. -Received two doses of COVID-19 vaccine, no booster. -Order lab work every six months, no need for office visit unless patient desires or issues arise.        Return in about 6 months (around 11/14/2023), or if symptoms worsen or fail to improve, for hyperlipidemia  lab only.    Donato Schultz, DO

## 2023-07-19 DIAGNOSIS — L219 Seborrheic dermatitis, unspecified: Secondary | ICD-10-CM | POA: Diagnosis not present

## 2023-07-19 DIAGNOSIS — L6681 Central centrifugal cicatricial alopecia: Secondary | ICD-10-CM | POA: Diagnosis not present

## 2023-07-19 DIAGNOSIS — L089 Local infection of the skin and subcutaneous tissue, unspecified: Secondary | ICD-10-CM | POA: Diagnosis not present

## 2023-07-19 DIAGNOSIS — L658 Other specified nonscarring hair loss: Secondary | ICD-10-CM | POA: Diagnosis not present

## 2023-08-02 ENCOUNTER — Ambulatory Visit (INDEPENDENT_AMBULATORY_CARE_PROVIDER_SITE_OTHER): Payer: BC Managed Care – PPO

## 2023-08-02 ENCOUNTER — Ambulatory Visit: Payer: BC Managed Care – PPO

## 2023-08-02 DIAGNOSIS — Z23 Encounter for immunization: Secondary | ICD-10-CM | POA: Diagnosis not present

## 2023-08-15 ENCOUNTER — Other Ambulatory Visit: Payer: Self-pay | Admitting: Family Medicine

## 2023-08-15 DIAGNOSIS — E785 Hyperlipidemia, unspecified: Secondary | ICD-10-CM

## 2023-10-09 DIAGNOSIS — H43312 Vitreous membranes and strands, left eye: Secondary | ICD-10-CM | POA: Diagnosis not present

## 2023-10-09 DIAGNOSIS — H43392 Other vitreous opacities, left eye: Secondary | ICD-10-CM | POA: Diagnosis not present

## 2023-10-24 DIAGNOSIS — H43392 Other vitreous opacities, left eye: Secondary | ICD-10-CM | POA: Diagnosis not present

## 2023-10-24 DIAGNOSIS — H43312 Vitreous membranes and strands, left eye: Secondary | ICD-10-CM | POA: Diagnosis not present

## 2023-10-26 ENCOUNTER — Telehealth: Payer: Self-pay | Admitting: Family Medicine

## 2023-10-26 ENCOUNTER — Other Ambulatory Visit: Payer: Self-pay | Admitting: Family Medicine

## 2023-10-26 DIAGNOSIS — Z1211 Encounter for screening for malignant neoplasm of colon: Secondary | ICD-10-CM

## 2023-10-26 NOTE — Telephone Encounter (Signed)
Copied from CRM [id number]. Topic: Referral - Question >> Oct 26, 2023  9:42 AM Theodis Sato wrote: Reason for CRM: Patient is inquiring about the colonoscopy she has spoke with Dr.Lowne about, patient is asking where it  will be and how can she schedule it.

## 2023-10-26 NOTE — Telephone Encounter (Signed)
Okay to place referral

## 2023-11-02 ENCOUNTER — Encounter: Payer: Self-pay | Admitting: Gastroenterology

## 2023-11-04 ENCOUNTER — Telehealth: Payer: Self-pay

## 2023-11-04 NOTE — Telephone Encounter (Signed)
Marilyn Green,  In completing this patient's chart prep for PV- it was documented that the patient has Arnold-Chiari malformation. Protocol states patients procedure must be done at the hospital, correct?  Thanks, Bre

## 2023-11-05 ENCOUNTER — Other Ambulatory Visit: Payer: Self-pay

## 2023-11-05 DIAGNOSIS — Z860100 Personal history of colon polyps, unspecified: Secondary | ICD-10-CM

## 2023-11-05 MED ORDER — SUFLAVE 178.7 G PO SOLR: 1.0000 | Freq: Once | ORAL | 0 refills | Status: AC

## 2023-11-05 NOTE — Telephone Encounter (Signed)
Patient aware that she has been scheduled for a colonoscopy at [facility] on 12-20-23 with Dr Barron Alvine.  Patient advised that she has been scheduled for a telephone previsit appointment on 12-03-23 at 3:30pm.  Patient agreed to plan and verbalized understanding.

## 2023-11-05 NOTE — Telephone Encounter (Signed)
John,  Please review previous message and contact patient.  I would feel more comfortable if you would speak with this patient as this medical diagnosis is part of the protocol for the patient to have the procedure at the hospital. Please/ Thank you Bre

## 2023-11-05 NOTE — Telephone Encounter (Signed)
Marilyn Green,  Patient returned the call and was transferred to me.  She had several questions regarding Arnold-Chiari malformation. I did not feel comfortable answering the questions and I apologized to the patient that I did not feel comfortable answering questions. I explained to her that I was not a nurse and would feel better if someone who is a nurse could answer her questions.  Can you please contact her.   Thank you, Joni Reining

## 2023-11-05 NOTE — Telephone Encounter (Signed)
Dr. Salena Saner,  Thank you for the quick response.  Dottie or Joni Reining, Can one of you please get this patient scheduled for a direct at the hospital?  Please let PV know if you change the PV to be closer to the hospital procedure date- Thank you Bre, PV RN

## 2023-11-05 NOTE — Telephone Encounter (Signed)
Dr. Salena Saner, This patient is not approved to have her procedure in the [facility] as she has a hx of arnold chiari malformation.  Please advise if you would prefer the patient have an OV or if she can be a direct at the hospital?  Side note=(PV scheduled in rm 52 on 02/12), procedure has been cancelled for [facility]. Thank you Bre, PV RN

## 2023-11-07 ENCOUNTER — Telehealth: Payer: Self-pay

## 2023-11-07 NOTE — Telephone Encounter (Signed)
 Copied from CRM [id number]. Topic: Referral - Question >> Nov 07, 2023 11:19 AM Corin V wrote: Reason for CRM: Patient called in and asked that Dr. Antonio Peacock nurse call her back to give an explanation about an issue with her colonoscopy. The GI doctor had her scheduled and then they cancelled the appointment last minute and told her it has to be done at the hospital due to a note they found in her chart from 10 years ago regarding her seeing a neurologist. She is wanting to better understand what this note is and why it is requiring her to ave the colonoscopy done at the hospital instead of an outpatient clinic. Please provide an update to the patient when possible.

## 2023-11-07 NOTE — Telephone Encounter (Signed)
Pt made aware. Pt verbalized understanding.

## 2023-11-13 DIAGNOSIS — Z1231 Encounter for screening mammogram for malignant neoplasm of breast: Secondary | ICD-10-CM | POA: Diagnosis not present

## 2023-11-13 DIAGNOSIS — Z01419 Encounter for gynecological examination (general) (routine) without abnormal findings: Secondary | ICD-10-CM | POA: Diagnosis not present

## 2023-11-13 DIAGNOSIS — Z130 Encounter for screening for diseases of the blood and blood-forming organs and certain disorders involving the immune mechanism: Secondary | ICD-10-CM | POA: Diagnosis not present

## 2023-11-13 LAB — HM PAP SMEAR: HPV Aptima: NEGATIVE

## 2023-11-13 LAB — HM MAMMOGRAPHY

## 2023-11-15 ENCOUNTER — Encounter: Payer: Self-pay | Admitting: Family Medicine

## 2023-11-15 ENCOUNTER — Ambulatory Visit: Payer: BC Managed Care – PPO | Admitting: Family Medicine

## 2023-11-15 VITALS — BP 138/70 | HR 73 | Temp 98.0°F | Resp 16 | Ht 64.5 in | Wt 127.6 lb

## 2023-11-15 DIAGNOSIS — E785 Hyperlipidemia, unspecified: Secondary | ICD-10-CM | POA: Diagnosis not present

## 2023-11-15 DIAGNOSIS — R030 Elevated blood-pressure reading, without diagnosis of hypertension: Secondary | ICD-10-CM | POA: Diagnosis not present

## 2023-11-15 DIAGNOSIS — Q0700 Arnold-Chiari syndrome without spina bifida or hydrocephalus: Secondary | ICD-10-CM | POA: Diagnosis not present

## 2023-11-15 LAB — TSH: TSH: 1.63 u[IU]/mL (ref 0.35–5.50)

## 2023-11-15 LAB — CBC WITH DIFFERENTIAL/PLATELET
Basophils Absolute: 0 10*3/uL (ref 0.0–0.1)
Basophils Relative: 0.8 % (ref 0.0–3.0)
Eosinophils Absolute: 0 10*3/uL (ref 0.0–0.7)
Eosinophils Relative: 1 % (ref 0.0–5.0)
HCT: 42.4 % (ref 36.0–46.0)
Hemoglobin: 13.7 g/dL (ref 12.0–15.0)
Lymphocytes Relative: 37.8 % (ref 12.0–46.0)
Lymphs Abs: 1.3 10*3/uL (ref 0.7–4.0)
MCHC: 32.4 g/dL (ref 30.0–36.0)
MCV: 87.2 fL (ref 78.0–100.0)
Monocytes Absolute: 0.3 10*3/uL (ref 0.1–1.0)
Monocytes Relative: 9.2 % (ref 3.0–12.0)
Neutro Abs: 1.8 10*3/uL (ref 1.4–7.7)
Neutrophils Relative %: 51.2 % (ref 43.0–77.0)
Platelets: 185 10*3/uL (ref 150.0–400.0)
RBC: 4.86 Mil/uL (ref 3.87–5.11)
RDW: 12.9 % (ref 11.5–15.5)
WBC: 3.5 10*3/uL — ABNORMAL LOW (ref 4.0–10.5)

## 2023-11-15 LAB — COMPREHENSIVE METABOLIC PANEL
ALT: 14 U/L (ref 0–35)
AST: 16 U/L (ref 0–37)
Albumin: 4.4 g/dL (ref 3.5–5.2)
Alkaline Phosphatase: 54 U/L (ref 39–117)
BUN: 16 mg/dL (ref 6–23)
CO2: 30 meq/L (ref 19–32)
Calcium: 9.6 mg/dL (ref 8.4–10.5)
Chloride: 101 meq/L (ref 96–112)
Creatinine, Ser: 0.92 mg/dL (ref 0.40–1.20)
GFR: 70.61 mL/min (ref >60.00)
Glucose, Bld: 85 mg/dL (ref 70–99)
Potassium: 4.3 meq/L (ref 3.5–5.1)
Sodium: 140 meq/L (ref 135–145)
Total Bilirubin: 1 mg/dL (ref 0.2–1.2)
Total Protein: 7.1 g/dL (ref 6.0–8.3)

## 2023-11-15 LAB — LIPID PANEL
Cholesterol: 158 mg/dL (ref 0–200)
HDL: 69.8 mg/dL (ref >39.00)
LDL Cholesterol: 79 mg/dL (ref 0–99)
NonHDL: 87.93
Total CHOL/HDL Ratio: 2
Triglycerides: 44 mg/dL (ref 0.0–149.0)
VLDL: 8.8 mg/dL (ref 0.0–40.0)

## 2023-11-15 NOTE — Progress Notes (Signed)
Established Patient Office Visit  Subjective   Patient ID: Marilyn Green, female    DOB: 11-03-1970  Age: 53 y.o. MRN: [id number]  Chief Complaint  Patient presents with   Hyperlipidemia   Follow-up   HPI   Discussed the use of AI scribe software for clinical note transcription with the patient, who gave verbal consent to proceed.  History of Present Illness   Marilyn Porco "Luster Landsberg" is a 53 year old female who presents for a routine follow-up and medication refill.  She has approximately fifteen tablets remaining of her cholesterol medication, which she receives in a 90-day supply with one refill. She anticipates having enough medication until May.  Her blood pressure was recently recorded at 124/80 during a visit to her OB-GYN. Today, it is slightly elevated at 138/80, which she finds concerning. She attributes this to the weather and recent activity.  She is due for a colonoscopy this year, but her appointment was canceled due to a note in her chart from an MRI done ten years ago, which revealed a Chiari malformation. She recalls being referred to a neurologist at that time, who advised her to monitor for symptoms like numbness or tingling in her hands, which she has not experienced. She underwent vestibular therapy and a nerve conduction study for carpal tunnel syndrome in 2018.  No significant changes in her family history are reported. She has not experienced frequent headaches recently and manages occasional migraines with Excedrin Migraine.      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   Vertigo 03/10/2014   Migraine without aura 06/19/2012   Past Medical History:  Diagnosis Date   Allergy     Diverticulitis    Family history of adverse reaction to anesthesia    mom PONV and trouble wakening and sister PONV   Migraine    Urethral stricture    Past Surgical History:  Procedure Laterality Date   COLONOSCOPY  2020   CYSTOSCOPY WITH URETHRAL DILATATION N/A 12/05/2022   Procedure: CYSTOSCOPY WITH URETHRAL DILATATION;  Surgeon: Noel Christmas, MD;  Location: [facility];  Service: Urology;  Laterality: N/A;   Social History   Tobacco Use   Smoking status: Never   Smokeless tobacco: Never  Vaping Use   Vaping status: Never Used  Substance Use Topics   Alcohol use: Not Currently   Drug use: No   Social History   Socioeconomic History   Marital status: Single    Spouse name: Not on file   Number of children: Not on file   Years of education: Not on file   Highest education level: Associate degree: occupational, Scientist, product/process development, or vocational program  Occupational History   Occupation: Civil Service fast streamer: [employer]  Tobacco Use   Smoking status: Never   Smokeless tobacco: Never  Vaping Use   Vaping status: Never Used  Substance and Sexual Activity   Alcohol use: Not Currently   Drug use: No   Sexual activity: Not Currently    Partners: Male  Other Topics Concern   Not on file  Social History Narrative   Exercise- no   Social Drivers of Health   Financial Resource Strain: Low Risk  (11/09/2023)   Overall  Financial Resource Strain (CARDIA)    Difficulty of Paying Living Expenses: Not hard at all  Food Insecurity: No Food Insecurity (11/09/2023)   Hunger Vital Sign    Worried About Running Out of Food in the Last Year: Never true    Ran Out of Food in the Last Year: Never true  Transportation Needs: No Transportation Needs (11/09/2023)   PRAPARE - Administrator, Civil Service (Medical): No    Lack of Transportation (Non-Medical): No  Physical Activity: Unknown (11/09/2023)   Exercise Vital Sign    Days of Exercise per Week: 0 days    Minutes  of Exercise per Session: Not on file  Stress: No Stress Concern Present (11/09/2023)   [employer] of Occupational Health - Occupational Stress Questionnaire    Feeling of Stress : Only a little  Social Connections: Moderately Isolated (11/09/2023)   Social Connection and Isolation Panel [NHANES]    Frequency of Communication with Friends and Family: More than three times a week    Frequency of Social Gatherings with Friends and Family: Once a week    Attends Religious Services: More than 4 times per year    Active Member of [employer] or Organizations: No    Attends Engineer, structural: Not on file    Marital Status: Never married  Catering manager Violence: Not on file   Family Status  Relation Name Status   Mother  Alive   Father  Deceased   Sister  Alive   Sister  Alive   Sister  Alive   Brother  Alive   Brother  Alive   MGM  Deceased   MGF  Deceased   PGM  Deceased   PGF  Deceased   Neg Hx  (Not Specified)  No partnership data on file   Family History  Problem Relation Age of Onset   Hyperlipidemia Mother    Hypertension Mother    Colon cancer Neg Hx    Esophageal cancer Neg Hx    Rectal cancer Neg Hx    Stomach cancer Neg Hx    No Known Allergies    Review of Systems  Constitutional:  Negative for chills, fever and malaise/fatigue.  HENT:  Negative for congestion and hearing loss.   Eyes:  Negative for blurred vision and discharge.  Respiratory:  Negative for cough, sputum production and shortness of breath.   Cardiovascular:  Negative for chest pain, palpitations and leg swelling.  Gastrointestinal:  Negative for abdominal pain, blood in stool, constipation, diarrhea, heartburn, nausea and vomiting.  Genitourinary:  Negative for dysuria, frequency, hematuria and urgency.  Musculoskeletal:  Negative for back pain, falls and myalgias.  Skin:  Negative for rash.  Neurological:  Negative for dizziness, sensory change, loss of consciousness, weakness and  headaches.  Endo/Heme/Allergies:  Negative for environmental allergies. Does not bruise/bleed easily.  Psychiatric/Behavioral:  Negative for depression and suicidal ideas. The patient is not nervous/anxious and does not have insomnia.       Objective:     BP 138/70 (BP Location: Left Arm, Patient Position: Sitting, Cuff Size: Normal)   Pulse 73   Temp 98 F (36.7 C) (Oral)   Resp 16   Ht 5' 4.5" (1.638 m)   Wt 127 lb 9.6 oz (57.9 kg)   LMP 01/25/2014   SpO2 95%   BMI 21.56 kg/m  BP Readings from Last 3 Encounters:  11/15/23 138/70  05/14/23 120/80  12/05/22 (!) 145/89   Wt Readings from Last  3 Encounters:  11/15/23 127 lb 9.6 oz (57.9 kg)  05/14/23 129 lb 9.6 oz (58.8 kg)  12/05/22 124 lb 11.2 oz (56.6 kg)   SpO2 Readings from Last 3 Encounters:  11/15/23 95%  05/14/23 100%  12/05/22 100%      Physical Exam Vitals and nursing note reviewed.  Constitutional:      General: She is not in acute distress.    Appearance: Normal appearance. She is well-developed.  HENT:     Head: Normocephalic and atraumatic.  Eyes:     General: No scleral icterus.       Right eye: No discharge.        Left eye: No discharge.  Cardiovascular:     Rate and Rhythm: Normal rate and regular rhythm.     Heart sounds: No murmur heard. Pulmonary:     Effort: Pulmonary effort is normal. No respiratory distress.     Breath sounds: Normal breath sounds.  Musculoskeletal:        General: Normal range of motion.     Cervical back: Normal range of motion and neck supple.     Right lower leg: No edema.     Left lower leg: No edema.  Skin:    General: Skin is warm and dry.  Neurological:     Mental Status: She is alert and oriented to person, place, and time.  Psychiatric:        Mood and Affect: Mood normal.        Behavior: Behavior normal.        Thought Content: Thought content normal.        Judgment: Judgment normal.      No results found for any visits on 11/15/23.  Last  CBC Lab Results  Component Value Date   WBC 3.1 (L) 05/14/2023   HGB 13.1 05/14/2023   HCT 41.8 05/14/2023   MCV 88.6 05/14/2023   MCH 27.5 12/11/2018   RDW 13.4 05/14/2023   PLT 191.0 05/14/2023   Last metabolic panel Lab Results  Component Value Date   GLUCOSE 87 05/14/2023   NA 138 05/14/2023   K 4.0 05/14/2023   CL 102 05/14/2023   CO2 31 05/14/2023   BUN 16 05/14/2023   CREATININE 0.92 05/14/2023   GFR 70.87 05/14/2023   CALCIUM 9.5 05/14/2023   PROT 6.6 05/14/2023   ALBUMIN 4.2 05/14/2023   BILITOT 0.6 05/14/2023   ALKPHOS 64 05/14/2023   AST 17 05/14/2023   ALT 15 05/14/2023   ANIONGAP 9 12/11/2018   Last lipids Lab Results  Component Value Date   CHOL 161 05/14/2023   HDL 63.90 05/14/2023   LDLCALC 86 05/14/2023   TRIG 56.0 05/14/2023   CHOLHDL 3 05/14/2023   Last hemoglobin A1c No results found for: "HGBA1C" Last thyroid functions Lab Results  Component Value Date   TSH 2.14 05/14/2023   Last vitamin D No results found for: "25OHVITD2", "25OHVITD3", "VD25OH" Last vitamin B12 and Folate No results found for: "VITAMINB12", "FOLATE"    The 10-year ASCVD risk score (Arnett DK, et al., 2019) is: 2.7%    Assessment & Plan:   Problem List Items Addressed This Visit       Unprioritized   Hyperlipidemia - Primary   Encourage heart healthy diet such as MIND or DASH diet, increase exercise, avoid trans fats, simple carbohydrates and processed foods, consider a krill or fish or flaxseed oil cap daily.   Tolerating crestor       Elevated  BP without diagnosis of hypertension   Bp good today      Relevant Orders   CBC with Differential/Platelet   Comprehensive metabolic panel   Lipid panel   TSH   Arnold-Chiari deformity (HCC)   Pt asymptomatic  Pt instructed to return to office if symptoms develop --- HA, dizziness etc       Relevant Orders   Ambulatory referral to Neurology   Assessment and Plan    Hypertension Blood pressure today  was 138 mmHg systolic, slightly elevated but not alarming, with a recent OB-GYN visit showing 124 mmHg systolic. No new symptoms or changes in family history. Concerns were addressed and reassurance provided. Recheck blood pressure before departure and monitor regularly.  Hyperlipidemia Currently taking cholesterol medication with approximately fifteen tablets remaining. A refill is available and should last until May. No new symptoms or concerns were discussed. Continue current medication and refill prescription as needed.  Arnold Chiari Malformation Type I Diagnosed ten years ago with no new symptoms such as numbness or tingling. Previous follow-up included vestibular therapy and a nerve conduction study for carpal tunnel syndrome. No recent headaches or other concerning symptoms. Educated on the condition and reassured about its mild nature. No immediate action unless new symptoms develop. Educate on signs to watch for, such as numbness or tingling, and consider MRI if new symptoms or severe headaches occur.  General Health Maintenance Recently visited OB-GYN and had a mammogram, with results pending. Due for a colonoscopy this year, rescheduled due to a previous MRI finding. Routine blood work to be done today. Follow up on mammogram results and forward to primary care via MyChart. Complete scheduled colonoscopy on February 20th. Routine blood work to be done today. Schedule a follow-up visit in six months or sooner if needed.       Return in about 6 months (around 05/14/2024), or if symptoms worsen or fail to improve, for fasting, annual exam.    Donato Schultz, DO

## 2023-11-15 NOTE — Assessment & Plan Note (Signed)
Encourage heart healthy diet such as MIND or DASH diet, increase exercise, avoid trans fats, simple carbohydrates and processed foods, consider a krill or fish or flaxseed oil cap daily.   Tolerating crestor

## 2023-11-15 NOTE — Assessment & Plan Note (Signed)
Pt asymptomatic  Pt instructed to return to office if symptoms develop --- HA, dizziness etc

## 2023-11-15 NOTE — Patient Instructions (Signed)
Chiari Malformation  Chiari malformation (CM) is a type of brain abnormality that affects the parts of the brain called the cerebellum and the brain stem. The cerebellum is important for balance, and the brain stem is important for basic body functions, such as breathing and swallowing. Normally, the cerebellum is located in a space at the back of the skull, just above the opening in the skull (foramen magnum)where the spinal cord meets the brain stem. With CM, part of the cerebellum is located below the foramen magnum instead. The malformation can be mild with no or few symptoms, or it can be severe. CM can cause neck pain, headaches, balance problems, and other symptoms. What are the causes? CM is a condition that a person is born with (congenital). In rare cases, CM may also develop later in life, which is called acquired CM or secondary CM. These cases may be caused by a leak of the fluid that surrounds and protects the brain and spinal cord (cerebrospinal fluid), leading to low pressure. In acquired or secondary CM, abnormal pressure develops in the brain. This pushes the cerebellum down into the foramen magnum. What increases the risk? The following factors may make you more likely to develop this condition: Being female. Having a family history of CM. What are the signs or symptoms? Symptoms of this condition may vary depending on the severity of your CM. In some cases, there are no symptoms. In other cases, symptoms may come and go. The most common symptom is a severe headache in the back of the head. The headache: May come and go. May spread to your neck and shoulders. May be worse when you cough, sneeze, or strain. Other symptoms include: Difficulty balancing or loss of coordination. Vision problems, such as double vision, tiny spots moving across your vision (floaters), or sensitivity to lights. Trouble swallowing or speaking or hearing. Feeling dizzy or fainting. Breathing problems,  including breathing pauses during sleep (sleep apnea). Curved back (scoliosis). Inability to control when you urinate (incontinence). How is this diagnosed? This condition may be evaluated with a medical history and physical exam. This may include tests to check your balance and nerves (neurological exam). You may also have imaging tests, such as a CT scan or an MRI. How is this treated? Treatment for this condition depends on the severity of your symptoms. You may be treated with: Surgery to prevent the malformation from getting worse, or to treat severe symptoms or symptoms that are getting worse. Medicines or alternative treatments to relieve headaches or neck pain. If you do not have symptoms, you may not need treatment. Follow these instructions at home: Medicines Take over-the-counter and prescription medicines only as told by your health care provider. Ask your health care provider if the medicine prescribed to you requires you to avoid driving or using machinery. General instructions If you feel like you might faint: Lie down right away and raise (elevate) your feet above the level of your heart. Breathe deeply and steadily. Wait until all of the symptoms have passed. If you have problems with dizziness, get up slowly when lying down. Take several minutes to sit and then stand. Ask your health care provider which activities are safe for you and if you have any activity restrictions. Do not use any products that contain nicotine or tobacco. These products include cigarettes, chewing tobacco, and vaping devices, such as e-cigarettes. If you need help quitting, ask your health care provider. Drink enough fluid to keep your urine pale yellow.  Consider joining a CM support group. Keep all follow-up visits. This is important. Where to find more information [employer] of Neurological Disorders and Stroke: [employer] Contact a health care provider if: You have new  symptoms. Your symptoms get worse. Get help right away if: You develop weakness or numbness in one or more of your limbs. You develop dizziness, slurred speech, double vision, weakness, or numbness with a severe headache. These symptoms may represent a serious problem that is an emergency. Do not wait to see if the symptoms will go away. Get medical help right away. Call your local emergency services (911 in the [place] Summary A Chiari malformation is a condition in which part of the cerebellum moves down through the foramen magnum. The malformation can be mild with no symptoms, or it can be severe. In some cases, no treatment is needed. In others, medicines are used to treat headaches. Surgery is done in the worst of cases. This information is not intended to replace advice given to you by your health care provider. Make sure you discuss any questions you have with your health care provider. Document Revised: 12/14/2020 Document Reviewed: 12/14/2020 Elsevier Patient Education  2024 [employer].

## 2023-11-15 NOTE — Assessment & Plan Note (Signed)
Bp good today

## 2023-12-03 ENCOUNTER — Ambulatory Visit (AMBULATORY_SURGERY_CENTER): Payer: BC Managed Care – PPO

## 2023-12-03 VITALS — Ht 64.5 in | Wt 128.0 lb

## 2023-12-03 DIAGNOSIS — Z860100 Personal history of colon polyps, unspecified: Secondary | ICD-10-CM

## 2023-12-03 MED ORDER — PEG 3350-KCL-NA BICARB-NACL 420 G PO SOLR: 4000.0000 mL | Freq: Once | ORAL | 0 refills | Status: AC

## 2023-12-03 NOTE — Progress Notes (Signed)

## 2023-12-06 ENCOUNTER — Encounter: Payer: Self-pay | Admitting: Gastroenterology

## 2023-12-07 ENCOUNTER — Encounter: Payer: BC Managed Care – PPO | Admitting: Gastroenterology

## 2023-12-12 ENCOUNTER — Telehealth: Payer: Self-pay | Admitting: Gastroenterology

## 2023-12-12 NOTE — Telephone Encounter (Signed)
 Procedure:Colonoscopy Procedure date: 12/20/23 Procedure location: [facility] Arrival Time: 8:00 am Spoke with the patient Y/N: Yes 12/12/23 @ 3:55 pm Any prep concerns? No  Has the patient obtained the prep from the pharmacy ? Yes Do you have a care partner and transportation: Yes Any additional concerns? No

## 2023-12-13 ENCOUNTER — Encounter (HOSPITAL_COMMUNITY): Payer: Self-pay | Admitting: Gastroenterology

## 2023-12-17 ENCOUNTER — Encounter: Payer: Self-pay | Admitting: Gastroenterology

## 2023-12-17 ENCOUNTER — Telehealth: Payer: Self-pay | Admitting: Gastroenterology

## 2023-12-17 NOTE — Telephone Encounter (Signed)
 Patient called and stated that she has questions regarding how her colonoscopy was coded. Patient is requesting a call back. Please advise.

## 2023-12-18 NOTE — Telephone Encounter (Signed)
 Patient was advised how procedure would be coded. Advised letter was uploaded on MyChart explaining possibility of insurance coverage. Patient had no other questions.

## 2023-12-19 NOTE — Telephone Encounter (Signed)
 Inbound call from patient in regards to previous note. Patient requesting f/u call/ please advise.   Thank you

## 2023-12-19 NOTE — Telephone Encounter (Signed)
 Patient is called because she is having question regarding how her colonoscopy was coded. Patient stated that she spoke to a co-worker of hers and has the same history as her but her colonoscopy is being coded as diagnostic. Patient is requesting a call back today due to her procedure starting tomorrow. Please advise.

## 2023-12-20 ENCOUNTER — Encounter (HOSPITAL_COMMUNITY): Payer: Self-pay | Admitting: Gastroenterology

## 2023-12-20 ENCOUNTER — Ambulatory Visit (HOSPITAL_COMMUNITY): Payer: Self-pay | Admitting: Certified Registered"

## 2023-12-20 ENCOUNTER — Encounter (HOSPITAL_COMMUNITY)
Admission: RE | Disposition: A | Discharge status: Home / Self Care | Payer: Self-pay | Source: Home / Self Care | Attending: Gastroenterology

## 2023-12-20 ENCOUNTER — Other Ambulatory Visit: Payer: Self-pay

## 2023-12-20 ENCOUNTER — Ambulatory Visit (HOSPITAL_COMMUNITY)
Admission: RE | Admit: 2023-12-20 | Discharge: 2023-12-20 | Disposition: A | Discharge status: Home / Self Care | Payer: BC Managed Care – PPO | Attending: Gastroenterology | Admitting: Gastroenterology

## 2023-12-20 DIAGNOSIS — G43909 Migraine, unspecified, not intractable, without status migrainosus: Secondary | ICD-10-CM | POA: Diagnosis not present

## 2023-12-20 DIAGNOSIS — Z1211 Encounter for screening for malignant neoplasm of colon: Secondary | ICD-10-CM | POA: Diagnosis not present

## 2023-12-20 DIAGNOSIS — Z860101 Personal history of adenomatous and serrated colon polyps: Secondary | ICD-10-CM

## 2023-12-20 DIAGNOSIS — Z860100 Personal history of colon polyps, unspecified: Secondary | ICD-10-CM

## 2023-12-20 DIAGNOSIS — Q0700 Arnold-Chiari syndrome without spina bifida or hydrocephalus: Secondary | ICD-10-CM | POA: Insufficient documentation

## 2023-12-20 DIAGNOSIS — R319 Hematuria, unspecified: Secondary | ICD-10-CM | POA: Insufficient documentation

## 2023-12-20 HISTORY — PX: COLONOSCOPY WITH PROPOFOL: SHX5780

## 2023-12-20 SURGERY — COLONOSCOPY WITH PROPOFOL
Anesthesia: Monitor Anesthesia Care

## 2023-12-20 MED ORDER — PROPOFOL 1000 MG/100ML IV EMUL
INTRAVENOUS | Status: AC
  Filled 2023-12-20: qty 100

## 2023-12-20 MED ORDER — PROPOFOL 10 MG/ML IV BOLUS
INTRAVENOUS | Status: DC | PRN
Start: 2023-12-20 — End: 2023-12-20
  Administered 2023-12-20 (×5): 20 mg via INTRAVENOUS

## 2023-12-20 MED ORDER — SODIUM CHLORIDE 0.9 % IV SOLN
INTRAVENOUS | Status: DC
Start: 2023-12-20 — End: 2023-12-20

## 2023-12-20 MED ORDER — LIDOCAINE 2% (20 MG/ML) 5 ML SYRINGE
INTRAMUSCULAR | Status: DC | PRN
  Administered 2023-12-20: 60 mg via INTRAVENOUS
  Administered 2023-12-20: 40 mg via INTRAVENOUS

## 2023-12-20 MED ORDER — PROPOFOL 500 MG/50ML IV EMUL
INTRAVENOUS | Status: DC | PRN
  Administered 2023-12-20: 150 ug/kg/min via INTRAVENOUS

## 2023-12-20 SURGICAL SUPPLY — 20 items
ELECT REM PT RETURN 9FT ADLT (ELECTROSURGICAL) IMPLANT
ELECTRODE REM PT RTRN 9FT ADLT (ELECTROSURGICAL) IMPLANT
FLOOR PAD 36X40 (MISCELLANEOUS) ×1 IMPLANT
FORCEPS BIOP RAD 4 LRG CAP 4 (CUTTING FORCEPS) IMPLANT
FORCEPS BIOP RJ4 240 W/NDL (CUTTING FORCEPS) IMPLANT
FORCEPS BXJMBJMB 240X2.8X (CUTTING FORCEPS) IMPLANT
INJECTOR/SNARE I SNARE (MISCELLANEOUS) IMPLANT
LUBRICANT JELLY 4.5OZ STERILE (MISCELLANEOUS) IMPLANT
MANIFOLD NEPTUNE II (INSTRUMENTS) IMPLANT
NDL SCLEROTHERAPY 25GX240 (NEEDLE) IMPLANT
NEEDLE SCLEROTHERAPY 25GX240 (NEEDLE) IMPLANT
PAD FLOOR 36X40 (MISCELLANEOUS) ×1 IMPLANT
PROBE APC STR FIRE (PROBE) IMPLANT
PROBE INJECTION GOLD 7FR (MISCELLANEOUS) IMPLANT
SNARE ROTATE MED OVAL 20MM (MISCELLANEOUS) IMPLANT
SYR 50ML LL SCALE MARK (SYRINGE) IMPLANT
TRAP SPECIMEN MUCOUS 40CC (MISCELLANEOUS) IMPLANT
TUBING ENDO SMARTCAP PENTAX (MISCELLANEOUS) IMPLANT
TUBING IRRIGATION ENDOGATOR (MISCELLANEOUS) ×1 IMPLANT
WATER STERILE IRR 1000ML POUR (IV SOLUTION) IMPLANT

## 2023-12-20 NOTE — Transfer of Care (Signed)
 Immediate Anesthesia Transfer of Care Note  Patient: Marilyn Green  Procedure(s) Performed: COLONOSCOPY WITH PROPOFOL  Patient Location: PACU and Endoscopy Unit  Anesthesia Type:MAC  Level of Consciousness: drowsy and responds to stimulation  Airway & Oxygen Therapy: Patient Spontanous Breathing and Patient connected to face mask oxygen  Post-op Assessment: Report given to RN and Post -op Vital signs reviewed and stable  Post vital signs: Reviewed and stable  Last Vitals:  Vitals Value Taken Time  BP 108/59   Temp    Pulse 80 12/20/23 0946  Resp 17 12/20/23 0946  SpO2 100 % 12/20/23 0946  Vitals shown include unfiled device data.  Last Pain:  Vitals:   12/20/23 0835  TempSrc: Temporal  PainSc: 0-No pain         Complications: No notable events documented.

## 2023-12-20 NOTE — Discharge Instructions (Signed)

## 2023-12-20 NOTE — H&P (Signed)
 GASTROENTEROLOGY PROCEDURE H&P NOTE   Primary Care Physician: Donato Schultz, DO    Reason for Procedure:  Colon polyp surveillance  Plan:    Colonoscopy  Patient is appropriate for endoscopic procedure(s) today.  The nature of the procedure, as well as the risks, benefits, and alternatives were carefully and thoroughly reviewed with the patient. Ample time for discussion and questions allowed. The patient understood, was satisfied, and agreed to proceed.     HPI: Marilyn Green is a 53 y.o. female who presents for colonoscopy for ongoing colon polyp surveillance and colon cancer screening.  No active GI symptoms.  No known family history of colon cancer or related malignancy.  Patient is otherwise without complaints or active issues today.  Last colonoscopy was 2020 and notable for 8 mm cecal SSP, small internal hemorrhoids, with recommendation to repeat in 5 years.  Due to history of Arnold-Chiari malformation, procedure scheduled at [facility] Endoscopy unit.  Past Medical History:  Diagnosis Date   Allergy    Diverticulitis    Family history of adverse reaction to anesthesia    mom PONV and trouble wakening and sister PONV   Migraine    Urethral stricture     Past Surgical History:  Procedure Laterality Date   COLONOSCOPY  2020   CYSTOSCOPY WITH URETHRAL DILATATION N/A 12/05/2022   Procedure: CYSTOSCOPY WITH URETHRAL DILATATION;  Surgeon: Noel Christmas, MD;  Location: [facility];  Service: Urology;  Laterality: N/A;    Prior to Admission medications   Medication Sig Start Date End Date Taking? Authorizing Provider  aspirin-acetaminophen-caffeine (EXCEDRIN MIGRAINE) [phone] MG tablet Take by mouth every 6 (six) hours as needed for headache or migraine.    [provider]  betamethasone, augmented, (DIPROLENE) 0.05 % lotion Apply 1 Application topically as directed. 3-4 times/week    [provider]  Biotin 1000 MCG  tablet Take 1,000 mcg by mouth daily.    [provider]  Cholecalciferol (VITAMIN D) 50 MCG (2000 UT) CAPS Take 2,000 Units by mouth daily.    [provider]  finasteride (PROSCAR) 5 MG tablet Take 0.5 tablets by mouth daily. 07/19/23   [provider]  rosuvastatin (CRESTOR) 10 MG tablet Take 1 tablet (10 mg total) by mouth daily. 08/16/23   Seabron Spates R, DO    No current facility-administered medications for this encounter.    Allergies as of 11/05/2023   (No Known Allergies)    Family History  Problem Relation Age of Onset   Hyperlipidemia Mother    Hypertension Mother    Colon cancer Neg Hx    Esophageal cancer Neg Hx    Rectal cancer Neg Hx    Stomach cancer Neg Hx     Social History   Socioeconomic History   Marital status: Single    Spouse name: Not on file   Number of children: Not on file   Years of education: Not on file   Highest education level: Associate degree: occupational, Scientist, product/process development, or vocational program  Occupational History   Occupation: Civil Service fast streamer: [employer]  Tobacco Use   Smoking status: Never   Smokeless tobacco: Never  Vaping Use   Vaping status: Never Used  Substance and Sexual Activity   Alcohol use: Not Currently   Drug use: No   Sexual activity: Not Currently    Partners: Male  Other Topics Concern   Not on file  Social History Narrative  Exercise- no   Social Drivers of Corporate investment banker Strain: Low Risk  (11/09/2023)   Overall Financial Resource Strain (CARDIA)    Difficulty of Paying Living Expenses: Not hard at all  Food Insecurity: No Food Insecurity (11/09/2023)   Hunger Vital Sign    Worried About Running Out of Food in the Last Year: Never true    Ran Out of Food in the Last Year: Never true  Transportation Needs: No Transportation Needs (11/09/2023)   PRAPARE - Administrator, Civil Service (Medical): No    Lack of Transportation (Non-Medical): No  Physical  Activity: Unknown (11/09/2023)   Exercise Vital Sign    Days of Exercise per Week: 0 days    Minutes of Exercise per Session: Not on file  Stress: No Stress Concern Present (11/09/2023)   [employer] of Occupational Health - Occupational Stress Questionnaire    Feeling of Stress : Only a little  Social Connections: Moderately Isolated (11/09/2023)   Social Connection and Isolation Panel [NHANES]    Frequency of Communication with Friends and Family: More than three times a week    Frequency of Social Gatherings with Friends and Family: Once a week    Attends Religious Services: More than 4 times per year    Active Member of [employer] or Organizations: No    Attends Engineer, structural: Not on file    Marital Status: Never married  Intimate Partner Violence: Not on file    Physical Exam: Vital signs in last 24 hours: @LMP  01/25/2014  GEN: NAD EYE: Sclerae anicteric ENT: MMM CV: Non-tachycardic Pulm: CTA b/l GI: Soft, NT/ND NEURO:  Alert & Oriented x 3   Doristine Locks, DO [facility]   12/20/2023 8:32 AM

## 2023-12-20 NOTE — Op Note (Signed)
 [facility] Patient Name: Marilyn Green Procedure Date: 12/20/2023 MRN: [id number] Attending MD: Doristine Locks , MD, [id number] Date of Birth: January 13, 1971 CSN: [id number] Age: 53 Admit Type: Outpatient Procedure:                Colonoscopy Indications:              High risk colon cancer surveillance: Personal                            history of sessile serrated colon polyp (less than                            10 mm in size) with no dysplasia                           Last colonoscopy was 2020 and notable for 8 mm                            cecal SSP, small internal hemorrhoids, with                            recommendation to repeat in 5 years. Providers:                Doristine Locks, MD, Vicki Mallet, RN, Geoffery Lyons, Technician Referring MD:              Medicines:                Monitored Anesthesia Care Complications:            No immediate complications. Estimated Blood Loss:     Estimated blood loss: none. Procedure:                Pre-Anesthesia Assessment:                           - Prior to the procedure, a History and Physical                            was performed, and patient medications and                            allergies were reviewed. The patient's tolerance of                            previous anesthesia was also reviewed. The risks                            and benefits of the procedure and the sedation                            options and risks were discussed with the patient.  All questions were answered, and informed consent                            was obtained. Prior Anticoagulants: The patient has                            taken no anticoagulant or antiplatelet agents. ASA                            Grade Assessment: II - A patient with mild systemic                            disease. After reviewing the risks and benefits,                            the patient  was deemed in satisfactory condition to                            undergo the procedure.                           After obtaining informed consent, the colonoscope                            was passed under direct vision. Throughout the                            procedure, the patient's blood pressure, pulse, and                            oxygen saturations were monitored continuously. The                            CF-HQ190L ([id number]) Olympus colonoscope was                            introduced through the anus and advanced to the the                            cecum, identified by appendiceal orifice and                            ileocecal valve. The colonoscopy was performed                            without difficulty. The patient tolerated the                            procedure well. The quality of the bowel                            preparation was excellent. The ileocecal valve,  appendiceal orifice, and rectum were photographed. Scope In: 9:28:05 AM Scope Out: 9:40:42 AM Scope Withdrawal Time: 0 hours 9 minutes 56 seconds  Total Procedure Duration: 0 hours 12 minutes 37 seconds  Findings:      The perianal and digital rectal examinations were normal.      The entire colon appeared normal.      The retroflexed view of the distal rectum and anal verge was normal and       showed no anal or rectal abnormalities. Impression:               - The entire examined colon is normal.                           - The distal rectum and anal verge are normal on                            retroflexion view.                           - No specimens collected. Moderate Sedation:      Not Applicable - Patient had care per Anesthesia. Recommendation:           - Patient has a contact number available for                            emergencies. The signs and symptoms of potential                            delayed complications were discussed with the                             patient. Return to normal activities tomorrow.                            Written discharge instructions were provided to the                            patient.                           - Resume previous diet.                           - Continue present medications.                           - Repeat colonoscopy in 7 years for surveillance.                           - Return to GI office PRN. Procedure Code(s):        --- Professional ---                           U9811, Colorectal cancer screening; colonoscopy on                            individual  at high risk Diagnosis Code(s):        --- Professional ---                           Z86.010, Personal history of colonic polyps CPT copyright 2022 American Medical Association. All rights reserved. The codes documented in this report are preliminary and upon coder review may  be revised to meet current compliance requirements. Doristine Locks, MD 12/20/2023 9:50:33 AM Number of Addenda: 0

## 2023-12-20 NOTE — Anesthesia Postprocedure Evaluation (Signed)
 Anesthesia Post Note  Patient: Marilyn Green  Procedure(s) Performed: COLONOSCOPY WITH PROPOFOL     Patient location during evaluation: PACU Anesthesia Type: MAC Level of consciousness: awake and alert Pain management: pain level controlled Vital Signs Assessment: post-procedure vital signs reviewed and stable Respiratory status: spontaneous breathing, nonlabored ventilation and respiratory function stable Cardiovascular status: blood pressure returned to baseline and stable Postop Assessment: no apparent nausea or vomiting Anesthetic complications: no   No notable events documented.  Last Vitals:  Vitals:   12/20/23 0955 12/20/23 1000  BP:  125/81  Pulse: 73 76  Resp: (!) 22 18  Temp:    SpO2: 100% 100%    Last Pain:  Vitals:   12/20/23 0950  TempSrc:   PainSc: 0-No pain                 Lowella Curb

## 2023-12-20 NOTE — Telephone Encounter (Signed)
 Inbound call from patient requesting a call regarding previous note. Please advise, thank you.

## 2023-12-20 NOTE — Anesthesia Preprocedure Evaluation (Signed)
 Anesthesia Evaluation  Patient identified by MRN, date of birth, ID band Patient awake    Reviewed: Allergy & Precautions, NPO status , Patient's Chart, lab work & pertinent test results  Airway Mallampati: I  TM Distance: >3 FB Neck ROM: Full    Dental no notable dental hx. (+) Teeth Intact, Dental Advisory Given   Pulmonary neg pulmonary ROS   Pulmonary exam normal breath sounds clear to auscultation       Cardiovascular Exercise Tolerance: Good negative cardio ROS Normal cardiovascular exam Rhythm:Regular Rate:Normal     Neuro/Psych  Headaches  negative psych ROS   GI/Hepatic negative GI ROS, Neg liver ROS,,,  Endo/Other    Renal/GU   Female GU complaint (Hematuria)     Musculoskeletal   Abdominal   Peds  Hematology   Anesthesia Other Findings   Reproductive/Obstetrics negative OB ROS                             Anesthesia Physical Anesthesia Plan  ASA: 2  Anesthesia Plan: MAC   Post-op Pain Management: Minimal or no pain anticipated   Induction: Intravenous  PONV Risk Score and Plan: 2 and Treatment may vary due to age or medical condition and Propofol infusion  Airway Management Planned: Natural Airway and Simple Face Mask  Additional Equipment: None  Intra-op Plan:   Post-operative Plan:   Informed Consent: I have reviewed the patients History and Physical, chart, labs and discussed the procedure including the risks, benefits and alternatives for the proposed anesthesia with the patient or authorized representative who has indicated his/her understanding and acceptance.     Dental advisory given  Plan Discussed with: CRNA and Surgeon  Anesthesia Plan Comments:         Anesthesia Quick Evaluation

## 2023-12-20 NOTE — Anesthesia Procedure Notes (Signed)
 Procedure Name: MAC Date/Time: 12/20/2023 9:20 AM  Performed by: Sindy Guadeloupe, CRNAPre-anesthesia Checklist: Patient identified, Emergency Drugs available, Suction available, Patient being monitored and Timeout performed Oxygen Delivery Method: Simple face mask Placement Confirmation: positive ETCO2

## 2023-12-20 NOTE — Interval H&P Note (Signed)
 History and Physical Interval Note:  12/20/2023 9:04 AM  Marilyn Green  has presented today for surgery, with the diagnosis of hx of colon polyps.  The various methods of treatment have been discussed with the patient and family. After consideration of risks, benefits and other options for treatment, the patient has consented to  Procedure(s): COLONOSCOPY WITH PROPOFOL (N/A) as a surgical intervention.  The patient's history has been reviewed, patient examined, no change in status, stable for surgery.  I have reviewed the patient's chart and labs.  Questions were answered to the patient's satisfaction.     Verlin Dike Kashus Karlen

## 2023-12-21 DIAGNOSIS — N35812 Other urethral bulbous stricture, male: Secondary | ICD-10-CM | POA: Diagnosis not present

## 2023-12-21 DIAGNOSIS — R311 Benign essential microscopic hematuria: Secondary | ICD-10-CM | POA: Diagnosis not present

## 2023-12-23 ENCOUNTER — Encounter (HOSPITAL_COMMUNITY): Payer: Self-pay | Admitting: Gastroenterology

## 2024-02-15 ENCOUNTER — Other Ambulatory Visit: Payer: Self-pay | Admitting: Family Medicine

## 2024-02-15 DIAGNOSIS — E785 Hyperlipidemia, unspecified: Secondary | ICD-10-CM

## 2024-03-24 ENCOUNTER — Ambulatory Visit: Admitting: Medical

## 2024-03-24 VITALS — BP 132/78 | HR 77 | Temp 98.2°F | Resp 12 | Ht 64.0 in | Wt 131.0 lb

## 2024-03-24 DIAGNOSIS — E860 Dehydration: Secondary | ICD-10-CM

## 2024-03-24 DIAGNOSIS — M25511 Pain in right shoulder: Secondary | ICD-10-CM | POA: Diagnosis not present

## 2024-03-24 DIAGNOSIS — R5383 Other fatigue: Secondary | ICD-10-CM | POA: Diagnosis not present

## 2024-03-24 NOTE — Patient Instructions (Signed)
 Fatigue and dehydration likely on friday. Possible related to heat exhaustion. Now stable. Symptoms consistent with heat exhaustion resolved after cooling and rehydration. Assessed as heat-related, not cardiac. Discussed hydration beverages sugar free propel or gatorade. - Order metabolic panel to assess electrolytes and renal function. - Advise hydration with water  or sugar-free electrolyte drinks, especially when working outside. - Recommend avoiding outdoor activities during peak heat hours, performing them early in the morning or late in the evening. -Order CBC, vitamin B12, thiamine, and thyroid  studies to rule out other causes of fatigue.  Right shoulder pain Right shoulder pain for two weeks, worsened over the weekend, likely rotator cuff pathology or bursitis. Pain relieved by ibuprofen. Discussed using low dose ibuprofen and tylenol . Make sure well hydrated. - Refer to sports medicine for further evaluation of the shoulder. - Advise use of acetaminophen  500 mg and ibuprofen 200-400 mg in combination for pain management, ensuring adequate hydration to prevent renal issues.   follow up date to be determined after lab review.

## 2024-03-24 NOTE — Progress Notes (Signed)
 Subjective:    Patient ID: Marilyn Green, female    DOB: 05/09/1971, 53 y.o.   MRN: [id number]  HPI  Marilyn Green is a 53 year old female who presents with episodes of heat-related weakness this past frida and right shoulder pain for 2 weeks.  She experienced an episode of heat-related weakness on Friday after working in the yard for 35 to 45 minutes between 12 and 1 PM. During this time, she felt very hot, weak, and  mild short of breath, with an absence of sweating, which was unusual for her. She had not eaten or drunk anything except coffee that morning, indicating poor hydration. She managed to alleviate her symptoms by slowing her breathing and applying a cold water  bottle to her neck and abdomen. The episode lasted less than 15 minutes, and she was able to resume normal activities shortly after. No history of asthma, chest pain, or significant shortness of breath/wheezing during the episode. Also no jaw pain or any left shoulder pain.  She reports right shoulder pain that has persisted for about two weeks. Initially, it was a mild ache when lying on her right side, but it worsened over the weekend, making it difficult to reach behind her back or lift her arm comfortably. The pain is described as feeling exacerbated by movement. She started taking ibuprofen yesterday, which has eased the pain but not completely resolved it. She denies any injury to the shoulder and has not experienced any pain in the left shoulder.  She has been hydrating with water  over the weekend and has not returned to working outside in the heat. No chest pain, shortness of breath, jaw pain, or left shoulder pain. She confirms right shoulder pain that worsens with movement and is relieved by ibuprofen.  Though not concerned for cardiac cause did review prior ekg in past which appeared normal.       Review of Systems  Constitutional:  Positive for fatigue.       But now back to normal.  HENT:  Negative  for dental problem, ear discharge and ear pain.   Respiratory:  Negative for cough, choking and wheezing.   Cardiovascular:  Negative for chest pain and palpitations.  Genitourinary:  Negative for dyspareunia and enuresis.  Musculoskeletal:  Negative for back pain.       Rt shoulder pain  Skin:  Negative for rash.  Neurological:  Positive for weakness. Negative for dizziness and syncope.  Hematological:  Negative for adenopathy. Does not bruise/bleed easily.  Psychiatric/Behavioral:  Negative for behavioral problems, decreased concentration and dysphoric mood. The patient is not nervous/anxious.     Past Medical History:  Diagnosis Date   Allergy    Diverticulitis    Family history of adverse reaction to anesthesia    mom PONV and trouble wakening and sister PONV   Migraine    Urethral stricture      Social History   Socioeconomic History   Marital status: Single    Spouse name: Not on file   Number of children: Not on file   Years of education: Not on file   Highest education level: Associate degree: occupational, Scientist, product/process development, or vocational program  Occupational History   Occupation: Civil Service fast streamer: [employer]  Tobacco Use   Smoking status: Never   Smokeless tobacco: Never  Vaping Use   Vaping status: Never Used  Substance and Sexual Activity   Alcohol use: Not Currently   Drug use: No   Sexual activity:  Not Currently    Partners: Male  Other Topics Concern   Not on file  Social History Narrative   Exercise- no   Social Drivers of Health   Financial Resource Strain: Low Risk  (03/24/2024)   Overall Financial Resource Strain (CARDIA)    Difficulty of Paying Living Expenses: Not hard at all  Food Insecurity: No Food Insecurity (03/24/2024)   Hunger Vital Sign    Worried About Running Out of Food in the Last Year: Never true    Ran Out of Food in the Last Year: Never true  Transportation Needs: No Transportation Needs (03/24/2024)   PRAPARE - Doctor, general practice (Medical): No    Lack of Transportation (Non-Medical): No  Physical Activity: Inactive (03/24/2024)   Exercise Vital Sign    Days of Exercise per Week: 0 days    Minutes of Exercise per Session: Not on file  Stress: No Stress Concern Present (03/24/2024)   [employer] of Occupational Health - Occupational Stress Questionnaire    Feeling of Stress: Only a little  Social Connections: Unknown (03/24/2024)   Social Connection and Isolation Panel    Frequency of Communication with Friends and Family: More than three times a week    Frequency of Social Gatherings with Friends and Family: Patient declined    Attends Religious Services: Patient declined    Active Member of Clubs or Organizations: Patient declined    Attends Banker Meetings: Not on file    Marital Status: Never married  Intimate Partner Violence: Not on file    Past Surgical History:  Procedure Laterality Date   COLONOSCOPY  2020   COLONOSCOPY WITH PROPOFOL  N/A 12/20/2023   Procedure: COLONOSCOPY WITH PROPOFOL ;  Surgeon: San Sandor GAILS, DO;  Location: [facility];  Service: Gastroenterology;  Laterality: N/A;   CYSTOSCOPY WITH URETHRAL DILATATION N/A 12/05/2022   Procedure: CYSTOSCOPY WITH URETHRAL DILATATION;  Surgeon: Elisabeth Valli BIRCH, MD;  Location: [facility];  Service: Urology;  Laterality: N/A;    Family History  Problem Relation Age of Onset   Hyperlipidemia Mother    Hypertension Mother    Colon cancer Neg Hx    Esophageal cancer Neg Hx    Rectal cancer Neg Hx    Stomach cancer Neg Hx     No Known Allergies  Current Outpatient Medications on File Prior to Visit  Medication Sig Dispense Refill   aspirin-acetaminophen -caffeine (EXCEDRIN MIGRAINE) 250-250-65 MG tablet Take by mouth every 6 (six) hours as needed for headache or migraine.     betamethasone, augmented, (DIPROLENE) 0.05 % lotion Apply 1 Application topically as directed. 3-4 times/week      Biotin 1000 MCG tablet Take 1,000 mcg by mouth daily.     Cholecalciferol (VITAMIN D) 50 MCG (2000 UT) CAPS Take 2,000 Units by mouth daily.     finasteride (PROSCAR) 5 MG tablet Take 0.5 tablets by mouth daily.     rosuvastatin  (CRESTOR ) 10 MG tablet TAKE 1 TABLET(10 MG) BY MOUTH DAILY 90 tablet 1   No current facility-administered medications on file prior to visit.    BP 132/78 (BP Location: Left Arm, Patient Position: Sitting, Cuff Size: Normal)   Pulse 77   Temp 98.2 F (36.8 C) (Oral)   Resp 12   Ht 5' 4 (1.626 m)   Wt 131 lb (59.4 kg)   LMP 01/25/2014   SpO2 99%   BMI 22.49 kg/m        Objective:  Physical Exam  General Mental Status- Alert. General Appearance- Not in acute distress.   Skin General: Color- Normal Color. Moisture- Normal Moisture.  Neck  No JVD.  Chest and Lung Exam Auscultation: Breath Sounds:-CTA  Cardiovascular Auscultation:Rythm- RRR. Murmurs & Other Heart Sounds:Auscultation of the heart reveals- No Murmurs.  Abdomen Inspection:-Inspeection Normal. Palpation/Percussion:Note:No mass. Palpation and Percussion of the abdomen reveal- Non Tender, Non Distended + BS, no rebound or guarding.   Rt shoulder- on lifting arm pain reproduced and can't abduct arm above shoulder level. Mild posterior shoulder tenderness to palpation.  Neurologic Cranial Nerve exam:- CN III-XII intact(No nystagmus), symmetric smile. Strength:- 5/5 equal and symmetric strength both upper and lower extremities.       Assessment & Plan:   Patient Instructions  Fatigue and dehydration likely on friday. Possible related to heat exhaustion. Now stable. Symptoms consistent with heat exhaustion resolved after cooling and rehydration. Assessed as heat-related, not cardiac. Discussed hydration beverages sugar free propel or gatorade. - Order metabolic panel to assess electrolytes and renal function. - Advise hydration with water  or sugar-free electrolyte drinks,  especially when working outside. - Recommend avoiding outdoor activities during peak heat hours, performing them early in the morning or late in the evening. -Order CBC, vitamin B12, thiamine, and thyroid  studies to rule out other causes of fatigue.  Right shoulder pain Right shoulder pain for two weeks, worsened over the weekend, likely rotator cuff pathology or bursitis. Pain relieved by ibuprofen. Discussed using low dose ibuprofen and tylenol . Make sure well hydrated. - Refer to sports medicine for further evaluation of the shoulder. - Advise use of acetaminophen  500 mg and ibuprofen 200-400 mg in combination for pain management, ensuring adequate hydration to prevent renal issues.   follow up date to be determined after lab review.   Darica Goren, PA-C

## 2024-03-25 ENCOUNTER — Ambulatory Visit: Payer: Self-pay | Admitting: Medical

## 2024-03-25 ENCOUNTER — Encounter: Payer: Self-pay | Admitting: Family Medicine

## 2024-03-25 LAB — CBC WITH DIFFERENTIAL/PLATELET
Basophils Absolute: 0.1 10*3/uL (ref 0.0–0.1)
Basophils Relative: 0.9 % (ref 0.0–3.0)
Eosinophils Absolute: 0 10*3/uL (ref 0.0–0.7)
Eosinophils Relative: 0.8 % (ref 0.0–5.0)
HCT: 40.9 % (ref 36.0–46.0)
Hemoglobin: 13.3 g/dL (ref 12.0–15.0)
Lymphocytes Relative: 28.8 % (ref 12.0–46.0)
Lymphs Abs: 1.6 10*3/uL (ref 0.7–4.0)
MCHC: 32.4 g/dL (ref 30.0–36.0)
MCV: 86.5 fl (ref 78.0–100.0)
Monocytes Absolute: 0.5 10*3/uL (ref 0.1–1.0)
Monocytes Relative: 8.2 % (ref 3.0–12.0)
Neutro Abs: 3.4 10*3/uL (ref 1.4–7.7)
Neutrophils Relative %: 61.3 % (ref 43.0–77.0)
Platelets: 190 10*3/uL (ref 150.0–400.0)
RBC: 4.73 Mil/uL (ref 3.87–5.11)
RDW: 13.8 % (ref 11.5–15.5)
WBC: 5.6 10*3/uL (ref 4.0–10.5)

## 2024-03-25 LAB — COMPREHENSIVE METABOLIC PANEL WITH GFR
ALT: 12 U/L (ref 0–35)
AST: 13 U/L (ref 0–37)
Albumin: 4.4 g/dL (ref 3.5–5.2)
Alkaline Phosphatase: 56 U/L (ref 39–117)
BUN: 11 mg/dL (ref 6–23)
CO2: 32 meq/L (ref 19–32)
Calcium: 9.8 mg/dL (ref 8.4–10.5)
Chloride: 102 meq/L (ref 96–112)
Creatinine, Ser: 0.83 mg/dL (ref 0.40–1.20)
GFR: 79.7 mL/min (ref >60.00)
Glucose, Bld: 90 mg/dL (ref 70–99)
Potassium: 4.6 meq/L (ref 3.5–5.1)
Sodium: 140 meq/L (ref 135–145)
Total Bilirubin: 0.9 mg/dL (ref 0.2–1.2)
Total Protein: 7.1 g/dL (ref 6.0–8.3)

## 2024-03-25 LAB — VITAMIN B12: Vitamin B-12: 269 pg/mL (ref 211–911)

## 2024-03-25 LAB — TSH: TSH: 1.5 u[IU]/mL (ref 0.35–5.50)

## 2024-03-25 LAB — T4, FREE: Free T4: 0.91 ng/dL (ref 0.60–1.60)

## 2024-03-27 LAB — VITAMIN B1: Vitamin B1 (Thiamine): 8 nmol/L (ref 8–30)

## 2024-03-28 NOTE — Progress Notes (Unsigned)
   LILLETTE Ileana Collet, PhD, LAT, ATC acting as a scribe for Artist Lloyd, MD.  Marilyn Green is a 53 y.o. female who presents to [facility] today for R shoulder pain ongoing since early June. Pt locates pain to ***  Radiates: Aggravates: Treatments tried: IBU  Pertinent review of systems: ***  Relevant historical information: ***   Exam:  LMP 01/25/2014  General: Well Developed, well nourished, and in no acute distress.   MSK: ***    Lab and Radiology Results No results found for this or any previous visit (from the past 72 hours). No results found.     Assessment and Plan: 53 y.o. female with ***   PDMP not reviewed this encounter. No orders of the defined types were placed in this encounter.  No orders of the defined types were placed in this encounter.    Discussed warning signs or symptoms. Please see discharge instructions. Patient expresses understanding.   ***

## 2024-03-31 ENCOUNTER — Other Ambulatory Visit: Payer: Self-pay

## 2024-03-31 ENCOUNTER — Ambulatory Visit: Admitting: Family Medicine

## 2024-03-31 ENCOUNTER — Encounter: Payer: Self-pay | Admitting: Family Medicine

## 2024-03-31 VITALS — BP 122/80 | HR 55 | Ht 64.0 in | Wt 130.0 lb

## 2024-03-31 DIAGNOSIS — G8929 Other chronic pain: Secondary | ICD-10-CM

## 2024-03-31 DIAGNOSIS — M25511 Pain in right shoulder: Secondary | ICD-10-CM

## 2024-03-31 NOTE — Patient Instructions (Addendum)
 Thank you for coming in today.   Please work on the home exercises the athletic trainer went over with you:  View at [email or web address] code [email or web address]  Check back as needed  I can order physical therapy, if not getting better.

## 2024-07-04 ENCOUNTER — Ambulatory Visit (INDEPENDENT_AMBULATORY_CARE_PROVIDER_SITE_OTHER)

## 2024-07-04 DIAGNOSIS — Z23 Encounter for immunization: Secondary | ICD-10-CM | POA: Diagnosis not present

## 2024-07-07 ENCOUNTER — Ambulatory Visit: Admitting: Family Medicine

## 2024-07-07 ENCOUNTER — Encounter: Payer: Self-pay | Admitting: Family Medicine

## 2024-07-07 VITALS — BP 130/80 | HR 61 | Temp 98.0°F | Resp 16 | Ht 64.0 in | Wt 133.2 lb

## 2024-07-07 DIAGNOSIS — E785 Hyperlipidemia, unspecified: Secondary | ICD-10-CM | POA: Diagnosis not present

## 2024-07-07 DIAGNOSIS — T7840XA Allergy, unspecified, initial encounter: Secondary | ICD-10-CM

## 2024-07-07 DIAGNOSIS — E559 Vitamin D deficiency, unspecified: Secondary | ICD-10-CM

## 2024-07-07 LAB — VITAMIN D 25 HYDROXY (VIT D DEFICIENCY, FRACTURES): VITD: 55.52 ng/mL (ref 30.00–100.00)

## 2024-07-07 MED ORDER — AZELASTINE HCL 0.1 % NA SOLN: 1.0000 | Freq: Two times a day (BID) | NASAL | 12 refills | Status: AC

## 2024-07-07 MED ORDER — FLUTICASONE PROPIONATE 50 MCG/ACT NA SUSP: 2.0000 | Freq: Every day | NASAL | 6 refills | Status: AC

## 2024-07-07 NOTE — Progress Notes (Signed)
 Subjective:    Patient ID: Marilyn Green, female    DOB: [birth year], 53 y.o.   MRN: [id number]  Chief Complaint  Patient presents with   Hyperlipidemia   Follow-up       HPI Patient is in today for cholesterol.  Discussed the use of AI scribe software for clinical note transcription with the patient, who gave verbal consent to proceed.  History of Present Illness Nazyia Jammy Stlouis is a 53 year old female who presents with a persistent headache and sinus congestion.  She has been experiencing a persistent headache for over a week, which began while at work. Initially, she took ibuprofen without relief, followed by Tylenol  and Excedrin Migraine, with only partial relief from the latter. The headache is described as non-severe but persistent, with a sensation of sinus congestion.  Despite the sensation of sinus congestion, she can breathe normally and sleep with her mouth closed. She has not used nasal sprays such as Flonase  or Astepro. She has antihistamines at home but is cautious about taking multiple medications.  She had COVID two years ago, which led to a loss of smell and taste and persistent fatigue. She has not received a recent COVID vaccine and is not interested in getting one.   Past Medical History:  Diagnosis Date   Allergy    Diverticulitis    Family history of adverse reaction to anesthesia    mom PONV and trouble wakening and sister PONV   Migraine    Urethral stricture     Past Surgical History:  Procedure Laterality Date   COLONOSCOPY  2020   COLONOSCOPY WITH PROPOFOL  N/A 12/20/2023   Procedure: COLONOSCOPY WITH PROPOFOL ;  Surgeon: San Sandor GAILS, DO;  Location: [facility];  Service: Gastroenterology;  Laterality: N/A;   CYSTOSCOPY WITH URETHRAL DILATATION N/A 12/05/2022   Procedure: CYSTOSCOPY WITH URETHRAL DILATATION;  Surgeon: Elisabeth Valli BIRCH, MD;  Location: [facility];  Service: Urology;  Laterality: N/A;    Family History   Problem Relation Age of Onset   Hyperlipidemia Mother    Hypertension Mother    Colon cancer Neg Hx    Esophageal cancer Neg Hx    Rectal cancer Neg Hx    Stomach cancer Neg Hx     Social History   Socioeconomic History   Marital status: Single    Spouse name: Not on file   Number of children: Not on file   Years of education: Not on file   Highest education level: Associate degree: occupational, Scientist, product/process development, or vocational program  Occupational History   Occupation: Civil Service fast streamer: [employer]  Tobacco Use   Smoking status: Never   Smokeless tobacco: Never  Vaping Use   Vaping status: Never Used  Substance and Sexual Activity   Alcohol use: Not Currently   Drug use: No   Sexual activity: Not Currently    Partners: Male  Other Topics Concern   Not on file  Social History Narrative   Exercise- no   Social Drivers of Health   Financial Resource Strain: Low Risk  (03/24/2024)   Overall Financial Resource Strain (CARDIA)    Difficulty of Paying Living Expenses: Not hard at all  Food Insecurity: No Food Insecurity (03/24/2024)   Hunger Vital Sign    Worried About Running Out of Food in the Last Year: Never true    Ran Out of Food in the Last Year: Never true  Transportation Needs: No Transportation Needs (03/24/2024)   PRAPARE -  Administrator, Civil Service (Medical): No    Lack of Transportation (Non-Medical): No  Physical Activity: Inactive (03/24/2024)   Exercise Vital Sign    Days of Exercise per Week: 0 days    Minutes of Exercise per Session: Not on file  Stress: No Stress Concern Present (03/24/2024)   [employer] of Occupational Health - Occupational Stress Questionnaire    Feeling of Stress: Only a little  Social Connections: Unknown (03/24/2024)   Social Connection and Isolation Panel    Frequency of Communication with Friends and Family: More than three times a week    Frequency of Social Gatherings with Friends and Family: Patient declined     Attends Religious Services: Patient declined    Database administrator or Organizations: Patient declined    Attends Engineer, structural: Not on file    Marital Status: Never married  Intimate Partner Violence: Not on file    Outpatient Medications Prior to Visit  Medication Sig Dispense Refill   aspirin-acetaminophen -caffeine (EXCEDRIN MIGRAINE) 250-250-65 MG tablet Take by mouth every 6 (six) hours as needed for headache or migraine.     betamethasone, augmented, (DIPROLENE) 0.05 % lotion Apply 1 Application topically as directed. 3-4 times/week     Biotin 1000 MCG tablet Take 1,000 mcg by mouth daily.     Cholecalciferol (VITAMIN D) 50 MCG (2000 UT) CAPS Take 2,000 Units by mouth daily.     finasteride (PROSCAR) 5 MG tablet Take 0.5 tablets by mouth daily.     rosuvastatin  (CRESTOR ) 10 MG tablet TAKE 1 TABLET(10 MG) BY MOUTH DAILY 90 tablet 1   No facility-administered medications prior to visit.    No Known Allergies  ROS     Objective:    Physical Exam  BP 130/80 (BP Location: Left Arm, Patient Position: Sitting, Cuff Size: Normal)   Pulse 61   Temp 98 F (36.7 C) (Oral)   Resp 16   Ht 5' 4 (1.626 m)   Wt 133 lb 3.2 oz (60.4 kg)   LMP 01/25/2014   SpO2 100%   BMI 22.86 kg/m  Wt Readings from Last 3 Encounters:  07/07/24 133 lb 3.2 oz (60.4 kg)  03/31/24 130 lb (59 kg)  03/24/24 131 lb (59.4 kg)    Diabetic Foot Exam - Simple   No data filed    Lab Results  Component Value Date   WBC 5.6 03/24/2024   HGB 13.3 03/24/2024   HCT 40.9 03/24/2024   PLT 190.0 03/24/2024   GLUCOSE 90 03/24/2024   CHOL 158 11/15/2023   TRIG 44.0 11/15/2023   HDL 69.80 11/15/2023   LDLCALC 79 11/15/2023   ALT 12 03/24/2024   AST 13 03/24/2024   NA 140 03/24/2024   K 4.6 03/24/2024   CL 102 03/24/2024   CREATININE 0.83 03/24/2024   BUN 11 03/24/2024   CO2 32 03/24/2024   TSH 1.50 03/24/2024    Lab Results  Component Value Date   TSH 1.50 03/24/2024    Lab Results  Component Value Date   WBC 5.6 03/24/2024   HGB 13.3 03/24/2024   HCT 40.9 03/24/2024   MCV 86.5 03/24/2024   PLT 190.0 03/24/2024   Lab Results  Component Value Date   NA 140 03/24/2024   K 4.6 03/24/2024   CO2 32 03/24/2024   GLUCOSE 90 03/24/2024   BUN 11 03/24/2024   CREATININE 0.83 03/24/2024   BILITOT 0.9 03/24/2024   ALKPHOS 56 03/24/2024  AST 13 03/24/2024   ALT 12 03/24/2024   PROT 7.1 03/24/2024   ALBUMIN 4.4 03/24/2024   CALCIUM  9.8 03/24/2024   ANIONGAP 9 12/11/2018   GFR 79.70 03/24/2024   Lab Results  Component Value Date   CHOL 158 11/15/2023   Lab Results  Component Value Date   HDL 69.80 11/15/2023   Lab Results  Component Value Date   LDLCALC 79 11/15/2023   Lab Results  Component Value Date   TRIG 44.0 11/15/2023   Lab Results  Component Value Date   CHOLHDL 2 11/15/2023   No results found for: HGBA1C     Assessment & Plan:  Hyperlipidemia, unspecified hyperlipidemia type -     Comprehensive metabolic panel with GFR -     Lipid panel  Vitamin D deficiency -     VITAMIN D 25 Hydroxy (Vit-D Deficiency, Fractures)  Allergy, initial encounter -     Azelastine HCl; Place 1 spray into both nostrils 2 (two) times daily. Use in each nostril as directed  Dispense: 30 mL; Refill: 12 -     Fluticasone  Propionate; Place 2 sprays into both nostrils daily.  Dispense: 16 g; Refill: 6  Assessment and Plan Assessment & Plan Headache with possible sinus etiology   Headache has persisted for over a week, possibly related to sinus issues. Ibuprofen, acetaminophen , and Excedrin migraine have provided no significant relief, though Excedrin eased symptoms slightly. She reports no significant nasal congestion or sinus pressure. Differential includes sinus-related headache or other causes. COVID-19 is considered but unlikely due to the duration and lack of other symptoms. Recommend using Flonase  or Astepro nasal spray to alleviate  potential sinus congestion and advise taking an antihistamine such as Zyrtec , Claritin, or Xyzal . Perform a COVID-19 test to rule out infection.  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  Hyperlipidemia   Hyperlipidemia is currently managed with cholesterol medication. The last refill is in progress, and she is not yet due for a new prescription. Plan to review blood work to assess current status.    Sekai Gitlin R Lowne Chase, DO

## 2024-07-07 NOTE — Assessment & Plan Note (Signed)
 Encourage heart healthy diet such as MIND or DASH diet, increase exercise, avoid trans fats, simple carbohydrates and processed foods, consider a krill or fish or flaxseed oil cap daily.

## 2024-07-08 LAB — COMPREHENSIVE METABOLIC PANEL WITH GFR
ALT: 20 U/L (ref 0–35)
AST: 18 U/L (ref 0–37)
Albumin: 4.4 g/dL (ref 3.5–5.2)
Alkaline Phosphatase: 61 U/L (ref 39–117)
BUN: 8 mg/dL (ref 6–23)
CO2: 31 meq/L (ref 19–32)
Calcium: 9.9 mg/dL (ref 8.4–10.5)
Chloride: 103 meq/L (ref 96–112)
Creatinine, Ser: 0.85 mg/dL (ref 0.40–1.20)
GFR: 77.3 mL/min (ref >60.00)
Glucose, Bld: 89 mg/dL (ref 70–99)
Potassium: 4.6 meq/L (ref 3.5–5.1)
Sodium: 144 meq/L (ref 135–145)
Total Bilirubin: 0.5 mg/dL (ref 0.2–1.2)
Total Protein: 7.1 g/dL (ref 6.0–8.3)

## 2024-07-08 LAB — LIPID PANEL
Cholesterol: 165 mg/dL (ref 0–200)
HDL: 75.2 mg/dL (ref >39.00)
LDL Cholesterol: 81 mg/dL (ref 0–99)
NonHDL: 89.99
Total CHOL/HDL Ratio: 2
Triglycerides: 44 mg/dL (ref 0.0–149.0)
VLDL: 8.8 mg/dL (ref 0.0–40.0)

## 2024-07-13 ENCOUNTER — Ambulatory Visit: Payer: Self-pay | Admitting: Family Medicine

## 2024-08-07 DIAGNOSIS — L658 Other specified nonscarring hair loss: Secondary | ICD-10-CM | POA: Diagnosis not present

## 2024-08-07 DIAGNOSIS — L6681 Central centrifugal cicatricial alopecia: Secondary | ICD-10-CM | POA: Diagnosis not present

## 2024-08-07 DIAGNOSIS — L219 Seborrheic dermatitis, unspecified: Secondary | ICD-10-CM | POA: Diagnosis not present

## 2024-08-15 ENCOUNTER — Other Ambulatory Visit: Payer: Self-pay | Admitting: Family Medicine

## 2024-08-15 DIAGNOSIS — E785 Hyperlipidemia, unspecified: Secondary | ICD-10-CM

## 2025-01-05 ENCOUNTER — Ambulatory Visit: Admitting: Family Medicine

## 2025-01-12 ENCOUNTER — Ambulatory Visit: Admitting: Family Medicine
